# Patient Record
Sex: Male | Born: 1965 | Race: White | Hispanic: No | Marital: Married | Smoking: Never smoker
Health system: Southern US, Community
[De-identification: ages and names within clinical notes are randomized; demographics above are authoritative.]

## PROBLEM LIST (undated history)

## (undated) DIAGNOSIS — L03115 Cellulitis of right lower limb: Secondary | ICD-10-CM

---

## 1986-11-16 HISTORY — PX: MANDIBLE FRACTURE SURGERY: SHX706

## 2012-11-16 HISTORY — PX: SHOULDER ARTHROSCOPY W/ ROTATOR CUFF REPAIR: SHX2400

## 2013-05-21 ENCOUNTER — Encounter (HOSPITAL_COMMUNITY): Payer: Self-pay | Admitting: Emergency Medicine

## 2013-05-21 ENCOUNTER — Emergency Department (HOSPITAL_COMMUNITY)
Admission: EM | Admit: 2013-05-21 | Discharge: 2013-05-21 | Disposition: A | Discharge status: Home / Self Care | Payer: BC Managed Care – PPO | Attending: Emergency Medicine | Admitting: Emergency Medicine

## 2013-05-21 DIAGNOSIS — Y998 Other external cause status: Secondary | ICD-10-CM | POA: Insufficient documentation

## 2013-05-21 DIAGNOSIS — L0231 Cutaneous abscess of buttock: Secondary | ICD-10-CM | POA: Insufficient documentation

## 2013-05-21 DIAGNOSIS — Y929 Unspecified place or not applicable: Secondary | ICD-10-CM | POA: Insufficient documentation

## 2013-05-21 DIAGNOSIS — Z79899 Other long term (current) drug therapy: Secondary | ICD-10-CM | POA: Insufficient documentation

## 2013-05-21 DIAGNOSIS — L03317 Cellulitis of buttock: Secondary | ICD-10-CM

## 2013-05-21 MED ORDER — OXYCODONE-ACETAMINOPHEN 5-325 MG PO TABS: 1.0000 | ORAL_TABLET | Freq: Once | ORAL | Status: DC

## 2013-05-21 MED ORDER — OXYCODONE-ACETAMINOPHEN 5-325 MG PO TABS: ORAL_TABLET | ORAL | Status: DC

## 2013-05-21 MED ORDER — SULFAMETHOXAZOLE-TRIMETHOPRIM 800-160 MG PO TABS: 1.0000 | ORAL_TABLET | Freq: Three times a day (TID) | ORAL | Status: DC

## 2013-05-21 NOTE — ED Provider Notes (Signed)
History  This chart was scribed for non-physician practitioner working with Juliet Rude. Rubin Payor, MD by Greggory Stallion, ED scribe. This patient was seen in room TR08C/TR08C and the patient's care was started at 3:22 PM.  CSN: [id number] Arrival date & time 05/21/13  1456   Chief Complaint  Patient presents with  . Insect Bite   The history is provided by the patient. No language interpreter was used.    HPI Comments: Edwin Bishop is a 47 y.o. male who presents to the Emergency Department complaining of an insect bite to his right upper thigh that happened three days ago. He states he thinks it is a spider bite. Pt rates his pain 5/10. He states it jumps to an 8/10 if he bumps the injured area. Pt states he was seen at a different facility and given Keflex with little relief. Denies fever, chills, nausea, vomiting.   History reviewed. No pertinent past medical history. History reviewed. No pertinent past surgical history. History reviewed. No pertinent family history. History  Substance Use Topics  . Smoking status: Never Smoker   . Smokeless tobacco: Not on file  . Alcohol Use: Yes     Comment: occ    Review of Systems  Constitutional: Negative for fever and diaphoresis.  HENT: Negative for neck pain and neck stiffness.   Eyes: Negative for visual disturbance.  Respiratory: Negative for apnea, chest tightness and shortness of breath.   Cardiovascular: Negative for chest pain and palpitations.  Gastrointestinal: Negative for nausea, vomiting, diarrhea and constipation.  Genitourinary: Negative for dysuria.  Musculoskeletal: Negative for gait problem.  Skin: Positive for wound. Negative for rash.  Neurological: Negative for dizziness, weakness, light-headedness, numbness and headaches.    Allergies  Review of patient's allergies indicates no known allergies.  Home Medications   Current Outpatient Rx  Name  Route  Sig  Dispense  Refill  . cephALEXin (KEFLEX) 500 MG  capsule   Oral   Take 500 mg by mouth 4 (four) times daily.         . fexofenadine (ALLEGRA) 180 MG tablet   Oral   Take 180 mg by mouth daily as needed (allergies).          BP 133/87  Pulse 103  Temp(Src) 98.3 F (36.8 C) (Oral)  Resp 18  SpO2 98%  Physical Exam  Nursing note and vitals reviewed. Constitutional: He is oriented to person, place, and time. He appears well-developed and well-nourished. No distress.  HENT:  Head: Normocephalic and atraumatic.  Eyes: Conjunctivae and EOM are normal.  Neck: Normal range of motion. Neck supple.  No meningeal signs  Cardiovascular: Normal rate, regular rhythm and normal heart sounds.  Exam reveals no gallop and no friction rub.   No murmur heard. Pulmonary/Chest: Effort normal and breath sounds normal. No respiratory distress. He has no wheezes. He has no rales. He exhibits no tenderness.  Abdominal: Soft. Bowel sounds are normal. He exhibits no distension. There is no tenderness. There is no rebound and no guarding.  Musculoskeletal: Normal range of motion. He exhibits no edema and no tenderness.  Neurological: He is alert and oriented to person, place, and time. No cranial nerve deficit.  Skin: Skin is warm and dry. He is not diaphoretic.  9 cm erythematous area over right buttocks. Warm and painful to touch.     ED Course  Procedures (including critical care time)  DIAGNOSTIC STUDIES: Oxygen Saturation is 98% on RA, normal by my interpretation.    COORDINATION  OF CARE: 3:52 PM-Discussed treatment plan which includes [place] to see if it needs to be drained, pain medication, and antibiotic with pt at bedside and pt agreed to plan.   Labs Reviewed - No data to display No results found. 1. Cellulitis of right buttock     MDM  Discussed pt case with Dr. Rubin Payor considering course of oral abx vs IV. Given that pt was started only on Keflex with no MRSA coverage, was afebrile at this point, no red streaking, and ultra sound  did not show any amenable drainage, agreed to add Bactrim to abx course, continue warm compresses, and directed return precautions for possible IV abx treatment. Discussed reasons to seek immediate care. Patient expresses understanding and agrees with plan. I personally performed the services described in this documentation, which was scribed in my presence. The recorded information has been reviewed and is accurate.    Glade Nurse, PA-C 05/22/13 2336  Glade Nurse, PA-C 05/22/13 2337

## 2013-05-21 NOTE — ED Notes (Signed)
Pt c/o possible spider bite to right upper thigh; redness and swelling noted with pain; pt sts started on cephalexin PO but not helping

## 2013-05-22 ENCOUNTER — Inpatient Hospital Stay (HOSPITAL_COMMUNITY): Payer: BC Managed Care – PPO

## 2013-05-22 ENCOUNTER — Encounter (HOSPITAL_COMMUNITY): Payer: Self-pay | Admitting: Emergency Medicine

## 2013-05-22 ENCOUNTER — Inpatient Hospital Stay (HOSPITAL_COMMUNITY)
Admission: EM | Admit: 2013-05-22 | Discharge: 2013-05-24 | DRG: 278 | Disposition: A | Discharge status: Home / Self Care | Payer: BC Managed Care – PPO | Attending: Internal Medicine | Admitting: Internal Medicine

## 2013-05-22 DIAGNOSIS — L0390 Cellulitis, unspecified: Secondary | ICD-10-CM | POA: Diagnosis present

## 2013-05-22 DIAGNOSIS — L03119 Cellulitis of unspecified part of limb: Principal | ICD-10-CM | POA: Diagnosis present

## 2013-05-22 DIAGNOSIS — W57XXXA Bitten or stung by nonvenomous insect and other nonvenomous arthropods, initial encounter: Secondary | ICD-10-CM | POA: Diagnosis present

## 2013-05-22 DIAGNOSIS — L02419 Cutaneous abscess of limb, unspecified: Principal | ICD-10-CM | POA: Diagnosis present

## 2013-05-22 DIAGNOSIS — T148 Other injury of unspecified body region: Secondary | ICD-10-CM | POA: Diagnosis present

## 2013-05-22 DIAGNOSIS — F17200 Nicotine dependence, unspecified, uncomplicated: Secondary | ICD-10-CM | POA: Diagnosis present

## 2013-05-22 DIAGNOSIS — M729 Fibroblastic disorder, unspecified: Secondary | ICD-10-CM | POA: Diagnosis present

## 2013-05-22 HISTORY — DX: Cellulitis of right lower limb: L03.115

## 2013-05-22 LAB — CBC WITH DIFFERENTIAL/PLATELET
Hemoglobin: 15.1 g/dL (ref 13.0–17.0)
Lymphocytes Relative: 18 % (ref 12–46)
Lymphs Abs: 1.7 10*3/uL (ref 0.7–4.0)
Monocytes Relative: 10 % (ref 3–12)
Neutro Abs: 6.8 10*3/uL (ref 1.7–7.7)
Neutrophils Relative %: 70 % (ref 43–77)
Platelets: 203 10*3/uL (ref 150–400)
RBC: 4.78 MIL/uL (ref 4.22–5.81)
WBC: 9.6 10*3/uL (ref 4.0–10.5)

## 2013-05-22 LAB — CBC
MCH: 30.8 pg (ref 26.0–34.0)
MCV: 89.7 fL (ref 78.0–100.0)
Platelets: 197 10*3/uL (ref 150–400)
RDW: 12.2 % (ref 11.5–15.5)
WBC: 9.3 10*3/uL (ref 4.0–10.5)

## 2013-05-22 LAB — BASIC METABOLIC PANEL
CO2: 24 mEq/L (ref 19–32)
Chloride: 100 mEq/L (ref 96–112)
GFR calc non Af Amer: 68 mL/min — ABNORMAL LOW (ref >90)
Glucose, Bld: 102 mg/dL — ABNORMAL HIGH (ref 70–99)
Potassium: 3.8 mEq/L (ref 3.5–5.1)
Sodium: 135 mEq/L (ref 135–145)

## 2013-05-22 LAB — HEPATIC FUNCTION PANEL
ALT: 21 U/L (ref 0–53)
Alkaline Phosphatase: 82 U/L (ref 39–117)
Bilirubin, Direct: 0.1 mg/dL (ref 0.0–0.3)
Indirect Bilirubin: 0.4 mg/dL (ref 0.3–0.9)

## 2013-05-22 MED ORDER — ONDANSETRON HCL 4 MG PO TABS: 4.0000 mg | ORAL_TABLET | Freq: Four times a day (QID) | ORAL | Status: DC | PRN

## 2013-05-22 MED ORDER — PIPERACILLIN-TAZOBACTAM 3.375 G IVPB
3.3750 g | Freq: Three times a day (TID) | INTRAVENOUS | Status: DC
  Administered 2013-05-22 – 2013-05-24 (×6): 3.375 g via INTRAVENOUS
  Filled 2013-05-22 (×9): qty 50

## 2013-05-22 MED ORDER — SODIUM CHLORIDE 0.9 % IJ SOLN
3.0000 mL | Freq: Two times a day (BID) | INTRAMUSCULAR | Status: DC
  Administered 2013-05-22 – 2013-05-24 (×3): 3 mL via INTRAVENOUS

## 2013-05-22 MED ORDER — VANCOMYCIN HCL 10 G IV SOLR
1500.0000 mg | Freq: Two times a day (BID) | INTRAVENOUS | Status: DC
  Administered 2013-05-22 – 2013-05-24 (×4): 1500 mg via INTRAVENOUS
  Filled 2013-05-22 (×6): qty 1500

## 2013-05-22 MED ORDER — ENOXAPARIN SODIUM 40 MG/0.4ML ~~LOC~~ SOLN
40.0000 mg | SUBCUTANEOUS | Status: DC
  Administered 2013-05-22 – 2013-05-23 (×2): 40 mg via SUBCUTANEOUS
  Filled 2013-05-22 (×3): qty 0.4

## 2013-05-22 MED ORDER — VANCOMYCIN HCL IN DEXTROSE 1-5 GM/200ML-% IV SOLN
1000.0000 mg | Freq: Once | INTRAVENOUS | Status: AC
  Administered 2013-05-22: 1000 mg via INTRAVENOUS
  Filled 2013-05-22: qty 200

## 2013-05-22 MED ORDER — OXYCODONE HCL 5 MG PO TABS
5.0000 mg | ORAL_TABLET | ORAL | Status: DC | PRN
  Administered 2013-05-23 (×2): 5 mg via ORAL
  Filled 2013-05-22 (×3): qty 1

## 2013-05-22 MED ORDER — HYDROMORPHONE HCL PF 1 MG/ML IJ SOLN
0.5000 mg | INTRAMUSCULAR | Status: DC | PRN
  Administered 2013-05-22 – 2013-05-23 (×3): 0.5 mg via INTRAVENOUS
  Filled 2013-05-22 (×3): qty 1

## 2013-05-22 MED ORDER — IOHEXOL 300 MG/ML  SOLN
100.0000 mL | Freq: Once | INTRAMUSCULAR | Status: AC | PRN
  Administered 2013-05-22: 100 mL via INTRAVENOUS

## 2013-05-22 MED ORDER — SODIUM CHLORIDE 0.9 % IV SOLN
INTRAVENOUS | Status: DC
  Administered 2013-05-22: 16:00:00 via INTRAVENOUS
  Administered 2013-05-22: 75 mL/h via INTRAVENOUS

## 2013-05-22 MED ORDER — ONDANSETRON HCL 4 MG/2ML IJ SOLN
4.0000 mg | Freq: Four times a day (QID) | INTRAMUSCULAR | Status: DC | PRN
  Administered 2013-05-22: 4 mg via INTRAVENOUS
  Filled 2013-05-22: qty 2
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
## 2013-05-22 NOTE — Progress Notes (Signed)
ANTIBIOTIC CONSULT NOTE - INITIAL  Pharmacy Consult for Vancomycin/Zosyn Indication: right leg wound infection  No Known Allergies  Patient Measurements: Height: 6' (182.9 cm) Weight: 205 lb (92.987 kg) IBW/kg (Calculated) : 77.6  Vital Signs: Temp: 98.9 F (37.2 C) (07/07 1340) Temp src: Oral (07/07 1340) BP: 117/80 mmHg (07/07 1340) Pulse Rate: 83 (07/07 1340) Intake/Output from previous day:   Intake/Output from this shift:    Labs:  Recent Labs  05/22/13 1303  WBC 9.6  HGB 15.1  PLT 203  CREATININE 1.24   Estimated Creatinine Clearance: 80.8 ml/min (by C-G formula based on Cr of 1.24). No results found for this basename: VANCOTROUGH, VANCOPEAK, VANCORANDOM, GENTTROUGH, GENTPEAK, GENTRANDOM, TOBRATROUGH, TOBRAPEAK, TOBRARND, AMIKACINPEAK, AMIKACINTROU, AMIKACIN,  in the last 72 hours   Microbiology: No results found for this or any previous visit (from the past 720 hour(s)).  Medical History: History reviewed. No pertinent past medical history.  Medications:   (Not in a hospital admission) Assessment: 47 y/o male patient admitted with worsening and presumed insect bite to right thigh requiring broad spectrum antibiotics for r/o necrotizing fascitis. Previously receiving keflex and bactrim prior to admission. Will begin vancomycin and zosyn, received 1g vanc in ED at 1200.  Goal of Therapy:  Vancomycin trough level 10-15 mcg/ml  Plan:  Vancomycin 1500mg  IV q12h, zosyn 3.375g IV q8h and monitor renal function. Measure antibiotic drug levels at steady state Follow up culture results  Verlene Mayer, PharmD, BCPS Pager [phone] 05/22/2013,3:31 PM

## 2013-05-22 NOTE — Consult Note (Signed)
Reason for Consult:cellulitis right thigh Referring Physician: Dr. Creola Corn  Edwin Bishop is an 47 y.o. male.  HPI: asked to the patient at the request of Dr. Timothy Lasso for cellulitis right upper thigh. [place] and felt an insect bite involving his right upper lateral thigh. He did not see with insect look like. Over the last 4 days he has developed increasing redness, swelling and pain. He has been on Bactrim and Keflex and the redness is more severe now with blistering centrally. No fever or chills. No nausea or vomiting. Denies headache or recent tick exposure. CT of the thigh was done which showed inflammation without abscess.  History reviewed. No pertinent past medical history.  Past Surgical History  Procedure Laterality Date  . Shoulder arthroscopy w/ rotator cuff repair Left 2014  . Mandible fracture surgery  1988    History reviewed. No pertinent family history.  Social History:  reports that he has never smoked. He has never used smokeless tobacco. He reports that  drinks alcohol. He reports that he does not use illicit drugs.  Allergies: No Known Allergies  Medications: I have reviewed the patient's current medications.  Results for orders placed during the hospital encounter of 05/22/13 (from the past 48 hour(s))  CBC WITH DIFFERENTIAL     Status: None   Collection Time    05/22/13  1:03 PM      Result Value Range   WBC 9.6  4.0 - 10.5 K/uL   RBC 4.78  4.22 - 5.81 MIL/uL   Hemoglobin 15.1  13.0 - 17.0 g/dL   HCT 16.1  09.6 - 04.5 %   MCV 88.3  78.0 - 100.0 fL   MCH 31.6  26.0 - 34.0 pg   MCHC 35.8  30.0 - 36.0 g/dL   RDW 40.9  81.1 - 91.4 %   Platelets 203  150 - 400 K/uL   Neutrophils Relative % 70  43 - 77 %   Neutro Abs 6.8  1.7 - 7.7 K/uL   Lymphocytes Relative 18  12 - 46 %   Lymphs Abs 1.7  0.7 - 4.0 K/uL   Monocytes Relative 10  3 - 12 %   Monocytes Absolute 1.0  0.1 - 1.0 K/uL   Eosinophils Relative 2  0 - 5 %   Eosinophils  Absolute 0.2  0.0 - 0.7 K/uL   Basophils Relative 0  0 - 1 %   Basophils Absolute 0.0  0.0 - 0.1 K/uL  BASIC METABOLIC PANEL     Status: Abnormal   Collection Time    05/22/13  1:03 PM      Result Value Range   Sodium 135  135 - 145 mEq/L   Potassium 3.8  3.5 - 5.1 mEq/L   Chloride 100  96 - 112 mEq/L   CO2 24  19 - 32 mEq/L   Glucose, Bld 102 (*) 70 - 99 mg/dL   BUN 12  6 - 23 mg/dL   Creatinine, Ser 7.82  0.50 - 1.35 mg/dL   Calcium 8.9  8.4 - 95.6 mg/dL   GFR calc non Af Amer 68 (*) >90 mL/min   GFR calc Af Amer 79 (*) >90 mL/min   Comment:            The eGFR has been calculated     using the CKD EPI equation.     This calculation has not been     validated in all clinical  situations.     eGFR's persistently     <90 mL/min signify     possible Chronic Kidney Disease.  CBC     Status: None   Collection Time    05/22/13  4:43 PM      Result Value Range   WBC 9.3  4.0 - 10.5 K/uL   RBC 4.58  4.22 - 5.81 MIL/uL   Hemoglobin 14.1  13.0 - 17.0 g/dL   HCT 09.8  11.9 - 14.7 %   MCV 89.7  78.0 - 100.0 fL   MCH 30.8  26.0 - 34.0 pg   MCHC 34.3  30.0 - 36.0 g/dL   RDW 82.9  56.2 - 13.0 %   Platelets 197  150 - 400 K/uL  CREATININE, SERUM     Status: Abnormal   Collection Time    05/22/13  4:43 PM      Result Value Range   Creatinine, Ser 1.09  0.50 - 1.35 mg/dL   GFR calc non Af Amer 79 (*) >90 mL/min   GFR calc Af Amer >90  >90 mL/min   Comment:            The eGFR has been calculated     using the CKD EPI equation.     This calculation has not been     validated in all clinical     situations.     eGFR's persistently     <90 mL/min signify     possible Chronic Kidney Disease.  HEPATIC FUNCTION PANEL     Status: None   Collection Time    05/22/13  4:43 PM      Result Value Range   Total Protein 6.9  6.0 - 8.3 g/dL   Albumin 3.6  3.5 - 5.2 g/dL   AST 18  0 - 37 U/L   ALT 21  0 - 53 U/L   Alkaline Phosphatase 82  39 - 117 U/L   Total Bilirubin 0.5  0.3 -  1.2 mg/dL   Bilirubin, Direct 0.1  0.0 - 0.3 mg/dL   Indirect Bilirubin 0.4  0.3 - 0.9 mg/dL    Ct Femur Right W Contrast  05/22/2013   *RADIOLOGY REPORT*  Clinical Data: Inflamed area of right hip/femur.  Fever.  Symptoms for 4 days.  Rule out necrotizing fasciitis.  CT OF THE RIGHT LOWER EXTREMITY WITH CONTRAST  Technique:  Continous axial images were obtained of the right femur after contrast administration.  Contrast: OMNIPAQUE IOHEXOL 300 MG/ML  SOLN  Comparison: None.  Findings: Imaging from the level of the acetabulum through the knee joint.  Soft tissue windows demonstrate  skin thickening and mild subcutaneous edema centered about the lateral aspect of the proximal right thigh musculature.  Example images 28 - 51/series 4. There is a suggestion of mild edema within the intramuscular planes of the lateral extensor musculature.  Example image 55/series 4. No significant enhancement in this area.  No fluid collections within the subcutaneous tissues or intramuscular regions to suggest abscess.  Subtle intramuscular edema terminates well above the patella.  Prominent right inguinal nodes at up to 1.1 cm, likely reactive.  No evidence of soft tissue gas.  Arterial structures are all patent.  No osseous abnormality to suggest osteomyelitis.  IMPRESSION:  1.  Skin thickening and subcutaneous edema lateral to the right thigh musculature, most consistent with cellulitis. 2.  Subtle edema suspected within the intramuscular planes of the lateral extensor musculature.  Mild  fasciitis  cannot be excluded. No specific findings of necrotizing fasciitis such as abscess or soft tissue/intramuscular gas. 3.  MRI with contrast may be of increased sensitivity to evaluate the extent of inflammation.   Original Report Authenticated By: Jeronimo Greaves, M.D.    Review of Systems  Constitutional: Negative for fever and chills.  Eyes: Negative.   Respiratory: Negative.   Cardiovascular: Negative.   Gastrointestinal:  Negative.   Genitourinary: Negative.   Musculoskeletal: Negative.   Skin: Positive for itching.  Neurological: Negative.  Negative for headaches.  Endo/Heme/Allergies: Negative.    Blood pressure 129/80, pulse 76, temperature 98.4 F (36.9 C), temperature source Oral, resp. rate 20, height 6' (1.829 m), weight 186 lb 9.6 oz (84.641 kg), SpO2 98.00%. Physical Exam  Constitutional: He appears well-developed and well-nourished.  HENT:  Head: Normocephalic and atraumatic.  Eyes: Pupils are equal, round, and reactive to light. No scleral icterus.  Neck: Neck supple.  Musculoskeletal: Normal range of motion.  Skin:     Psychiatric: He has a normal mood and affect. His behavior is normal. Judgment and thought content normal.    Assessment/Plan: Right thigh cellulitis without abscess secondary to insect bite  Agree with IV vancomycin and Zosyn.  We'll reexamine in a.m. If worse, may require surgical debridement. Not systemically ill at this point in time.  Secilia Apps A. 05/22/2013, 8:00 PM

## 2013-05-22 NOTE — ED Notes (Signed)
Admit Doctor at bedside.  

## 2013-05-22 NOTE — ED Provider Notes (Signed)
History    CSN: [id number] Arrival date & time 05/22/13  1111  First MD Initiated Contact with Patient 05/22/13 1112     Chief Complaint  Patient presents with  . Insect Bite   (Consider location/radiation/quality/duration/timing/severity/associated sxs/prior Treatment) HPI Comments: Patient is a 47 year old male who presents today with worsening wound infection. [place] he felt something crawl up his leg and bite him. He did not see the insect that bit him. He was seen at urgent care and was given Bactrim. He was seen at the emergency room yesterday and was given Keflex. He can take most antibiotics. The pain has worsened since yesterday. He describes the pain as throbbing. The area of erythema surrounding the wound has increased past the skin markings made yesterday. There is no streaking. He has had fevers which he states the highest he has never measured was 100.4. There were blisters yesterday, but the blisters have also increased in size.  The history is provided by the patient. No language interpreter was used.   No past medical history on file. No past surgical history on file. No family history on file. History  Substance Use Topics  . Smoking status: Never Smoker   . Smokeless tobacco: Not on file  . Alcohol Use: Yes     Comment: occ    Review of Systems  Constitutional: Positive for fever and chills.  Respiratory: Negative for shortness of breath.   Cardiovascular: Negative for chest pain.  Gastrointestinal: Positive for nausea. Negative for vomiting.  Skin: Positive for rash.  All other systems reviewed and are negative.    Allergies  Review of patient's allergies indicates no known allergies.  Home Medications   Current Outpatient Rx  Name  Route  Sig  Dispense  Refill  . cephALEXin (KEFLEX) 500 MG capsule   Oral   Take 500 mg by mouth 4 (four) times daily.         . fexofenadine (ALLEGRA) 180 MG tablet   Oral    Take 180 mg by mouth daily as needed (allergies).         Marland Kitchen oxyCODONE-acetaminophen (PERCOCET/ROXICET) 5-325 MG per tablet      Take one or two tablets every 4 to 6 hours as needed for severe pain - Do not take with Tylenol as this tablet already contains tylenol   10 tablet   0   . sulfamethoxazole-trimethoprim (SEPTRA DS) 800-160 MG per tablet   Oral   Take 1 tablet by mouth 3 (three) times daily.   30 tablet   0    BP 141/93  Pulse 67  Temp(Src) 99.1 F (37.3 C)  Resp 16  Ht 6' (1.829 m)  Wt 205 lb (92.987 kg)  BMI 27.8 kg/m2  SpO2 98% Physical Exam  Nursing note and vitals reviewed. Constitutional: He is oriented to person, place, and time. He appears well-developed and well-nourished. No distress.  HENT:  Head: Normocephalic and atraumatic.  Right Ear: External ear normal.  Left Ear: External ear normal.  Nose: Nose normal.  Eyes: Conjunctivae are normal.  Neck: Normal range of motion. No tracheal deviation present.  Cardiovascular: Normal rate, regular rhythm and normal heart sounds.   Pulmonary/Chest: Effort normal and breath sounds normal. No stridor.  Abdominal: Soft. He exhibits no distension. There is no tenderness.  Musculoskeletal: Normal range of motion.  Neurological: He is alert and oriented to person, place, and time.  Skin: Skin is warm and  dry. He is not diaphoretic. There is erythema.  10 cm area of erythema with circular area of ecchymosis; some blistering centrally; streaking of erythema  US performed - no fluid collection   Psychiatric: He has a normal mood and affect. His behavior is normal.    ED Course  Procedures (including critical care time) Labs Reviewed  CBC WITH DIFFERENTIAL  BASIC METABOLIC PANEL  B. BURGDORFI ANTIBODIES  ROCKY MTN SPOTTED FVR AB, IGG-BLOOD   Ct Femur Right W Contrast  05/22/2013   *RADIOLOGY REPORT*  Clinical Data: Inflamed area of right hip/femur.  Fever.  Symptoms for 4 days.  Rule out necrotizing fasciitis.   CT OF THE RIGHT LOWER EXTREMITY WITH CONTRAST  Technique:  Continous axial images were obtained of the right femur after contrast administration.  Contrast: OMNIPAQUE IOHEXOL 300 MG/ML  SOLN  Comparison: None.  Findings: Imaging from the level of the acetabulum through the knee joint.  Soft tissue windows demonstrate  skin thickening and mild subcutaneous edema centered about the lateral aspect of the proximal right thigh musculature.  Example images 28 - 51/series 4. There is a suggestion of mild edema within the intramuscular planes of the lateral extensor musculature.  Example image 55/series 4. No significant enhancement in this area.  No fluid collections within the subcutaneous tissues or intramuscular regions to suggest abscess.  Subtle intramuscular edema terminates well above the patella.  Prominent right inguinal nodes at up to 1.1 cm, likely reactive.  No evidence of soft tissue gas.  Arterial structures are all patent.  No osseous abnormality to suggest osteomyelitis.  IMPRESSION:  1.  Skin thickening and subcutaneous edema lateral to the right thigh musculature, most consistent with cellulitis. 2.  Subtle edema suspected within the intramuscular planes of the lateral extensor musculature.  Mild  fasciitis cannot be excluded. No specific findings of necrotizing fasciitis such as abscess or soft tissue/intramuscular gas. 3.  MRI with contrast may be of increased sensitivity to evaluate the extent of inflammation.   Original Report Authenticated By: Jeronimo Greaves, M.D.   1. Cellulitis     MDM  Patient presents with worsening cellulitis around possible insect bite. IV Vancomycin given in ED. Titers for lyme disease and RMSF were drawn. Results pending. Streaking worsened in ED stay. Call placed to [facility] who agree to admit. Vital signs stable for transfer to floor.   Mora Bellman, PA-C 05/23/13 1127

## 2013-05-22 NOTE — ED Provider Notes (Signed)
Medical screening examination/treatment/procedure(s) were conducted as a shared visit with non-physician practitioner(s) and myself.  I personally evaluated the patient during the encounter  Pt with onset of pain, redness to lateral right thigh, seen in the ED, on keflex and bactrim, now redness is worsening, streaking developing to proximal thigh.  Unsure of what may have bitten him.  On exam, tender, not fluctuant, central blistering and a ring of ecchymosis as though the source had some local toxicity to it causing a local necrosis or coagulopathy.  No bruising or bleeding elsewhere. Due to treatment failure with oral, given IV abx here, but it seems redness is continuing to worsen, will admit for observation and continued IV abx.    Impression: Cellulitis of right thigh, subsequent encounter   [name], MD 05/22/13 1443

## 2013-05-22 NOTE — ED Notes (Signed)
Onset 3 days ago right thigh lateral aspect insect bite with area growing in size light pink redness blue purple. Skin marked one day ago and redness past boarder. Pain 8/10 achy dull sharp.

## 2013-05-22 NOTE — H&P (Addendum)
[facility] History and Physical  Edwin Bishop BMW:[id number] DOB: 1966-07-21 DOA: 05/22/2013  Referring physician:  PCP: No primary provider on file.   Chief Complaint:     HPI:  47 year old male who presents today with worsening wound infection. 4 [place] and states he felt something crawl up his leg and bite him. He did not see the insect that bit him. He was seen at urgent care and was given Bactrim. He was seen at the emergency room yesterday and was given Keflex. He can take most antibiotics. The pain has worsened since yesterday. He describes the pain as throbbing. The area of erythema surrounding the wound has increased past the skin markings made yesterday. There is no streaking. He has had fevers which he states the highest he has never measured was 100.4. There were blisters yesterday, but the blisters have also increased in size.       Review of Systems: negative for the following  Constitutional: Denies fever, chills, diaphoresis, appetite change and fatigue.  HEENT: Denies photophobia, eye pain, redness, hearing loss, ear pain, congestion, sore throat, rhinorrhea, sneezing, mouth sores, trouble swallowing, neck pain, neck stiffness and tinnitus.  Respiratory: Denies SOB, DOE, cough, chest tightness, and wheezing.  Cardiovascular: Denies chest pain, palpitations and leg swelling.  Gastrointestinal: Denies nausea, vomiting, abdominal pain, diarrhea, constipation, blood in stool and abdominal distention.  Genitourinary: Denies dysuria, urgency, frequency, hematuria, flank pain and difficulty urinating.  Musculoskeletal: Denies myalgias, back pain, joint swelling, arthralgias and gait problem.  Skin: Denies pallor, rash and wound.  Neurological: Denies dizziness, seizures, syncope, weakness, light-headedness, numbness and headaches.  Hematological: Denies adenopathy. Easy bruising, personal or family bleeding history  Psychiatric/Behavioral:  Denies suicidal ideation, mood changes, confusion, nervousness, sleep disturbance and agitation       History reviewed. No pertinent past medical history.   Past Surgical History  Procedure Laterality Date  . Rotator cuff repair        Social History:  reports that he has never smoked. He does not have any smokeless tobacco history on file. He reports that  drinks alcohol. He reports that he does not use illicit drugs.    No Known Allergies  No family history on file.   Prior to Admission medications   Medication Sig Start Date End Date Taking? Authorizing Provider  cephALEXin (KEFLEX) 500 MG capsule Take 500 mg by mouth 4 (four) times daily. 05/20/13  Yes Historical Provider, MD  fexofenadine (ALLEGRA) 180 MG tablet Take 180 mg by mouth daily as needed (allergies).   Yes Historical Provider, MD  oxyCODONE-acetaminophen (PERCOCET/ROXICET) 5-325 MG per tablet Take one or two tablets every 4 to 6 hours as needed for severe pain - Do not take with Tylenol as this tablet already contains tylenol 05/21/13  Yes Glade Nurse, PA-C  sulfamethoxazole-trimethoprim (SEPTRA DS) 800-160 MG per tablet Take 1 tablet by mouth 3 (three) times daily. 05/21/13  Yes Glade Nurse, PA-C     Physical Exam: Filed Vitals:   05/22/13 1125 05/22/13 1340  BP: 141/93 117/80  Pulse: 67 83  Temp: 99.1 F (37.3 C) 98.9 F (37.2 C)  TempSrc:  Oral  Resp: 16 18  Height: 6' (1.829 m)   Weight: 92.987 kg (205 lb)   SpO2: 98% 98%     Constitutional: Vital signs reviewed. Patient is a well-developed and well-nourished in no acute distress and cooperative with exam. Alert and oriented x3.  Head: Normocephalic and atraumatic  Ear: TM normal  bilaterally  Mouth: no erythema or exudates, MMM  Eyes: PERRL, EOMI, conjunctivae normal, No scleral icterus.  Neck: Supple, Trachea midline normal ROM, No JVD, mass, thyromegaly, or carotid bruit present.  Cardiovascular: RRR, S1 normal, S2 normal, no MRG, pulses  symmetric and intact bilaterally  Pulmonary/Chest: CTAB, no wheezes, rales, or rhonchi  Abdominal: Soft. Non-tender, non-distended, bowel sounds are normal, no masses, organomegaly, or guarding present.  GU: no CVA tenderness Musculoskeletal: No joint deformities, erythema, or stiffness, ROM full and no nontender Ext: no edema and no cyanosis, pulses palpable bilaterally (DP and PT)  Hematology: no cervical, inginal, or axillary adenopathy.  Neurological: A&O x3, Strenght is normal and symmetric bilaterally, cranial nerve II-XII are grossly intact, no focal motor deficit, sensory intact to light touch bilaterally.  Skin: Skin is warm and dry. He is not diaphoretic. There is erythema.  10 cm area of erythema with circular area of ecchymosis; some blistering centrally; streaking of erythema  US performed - no fluid collection   Psychiatric: Normal mood and affect. speech and behavior is normal. Judgment and thought content normal. Cognition and memory are normal.       Labs on Admission:    Basic Metabolic Panel:  Recent Labs Lab 05/22/13 1303  NA 135  K 3.8  CL 100  CO2 24  GLUCOSE 102*  BUN 12  CREATININE 1.24  CALCIUM 8.9   Liver Function Tests: No results found for this basename: AST, ALT, ALKPHOS, BILITOT, PROT, ALBUMIN,  in the last 168 hours No results found for this basename: LIPASE, AMYLASE,  in the last 168 hours No results found for this basename: AMMONIA,  in the last 168 hours CBC:  Recent Labs Lab 05/22/13 1303  WBC 9.6  NEUTROABS 6.8  HGB 15.1  HCT 42.2  MCV 88.3  PLT 203   Cardiac Enzymes: No results found for this basename: CKTOTAL, CKMB, CKMBINDEX, TROPONINI,  in the last 168 hours  BNP (last 3 results) No results found for this basename: PROBNP,  in the last 8760 hours    CBG: No results found for this basename: GLUCAP,  in the last 168 hours  Radiological Exams on Admission: No results found.  EKG: Independently reviewed.     Assessment/Plan Active Problems:   * No active hospital problems. *   Cellulitis The patient on vancomycin and Zosyn He may need a CT scan to rule out necrotizing fascitis Blood cultures have been drawn May need a surgical consultation as well           full Family Communication: bedside Disposition Plan: admit   Time spent: 70 mins   [facility] Pager [phone]  If 7PM-7AM, please contact night-coverage [email or web address] Password [email or web address] 05/22/2013, 3:10 PM

## 2013-05-23 LAB — ROCKY MTN SPOTTED FVR AB, IGM-BLOOD: RMSF IgM: 0.26 IV (ref 0.00–0.89)

## 2013-05-23 LAB — COMPREHENSIVE METABOLIC PANEL
AST: 17 U/L (ref 0–37)
Albumin: 3.4 g/dL — ABNORMAL LOW (ref 3.5–5.2)
Calcium: 8.8 mg/dL (ref 8.4–10.5)
Creatinine, Ser: 1.09 mg/dL (ref 0.50–1.35)

## 2013-05-23 LAB — CBC
Hemoglobin: 14 g/dL (ref 13.0–17.0)
MCH: 30.6 pg (ref 26.0–34.0)
MCV: 88.6 fL (ref 78.0–100.0)
RBC: 4.58 MIL/uL (ref 4.22–5.81)

## 2013-05-23 LAB — B. BURGDORFI ANTIBODIES: B burgdorferi Ab IgG+IgM: 0.21 {ISR}

## 2013-05-23 LAB — ROCKY MTN SPOTTED FVR AB, IGG-BLOOD: RMSF IgG: 0.23 IV

## 2013-05-23 NOTE — Progress Notes (Addendum)
Subjective: F/Up R Thigh Cellulitis. Less pain. Mild less Streaky Min improvement overnight. Appreciate Dr Luisa Hart eval   Objective: Vital signs in last 24 hours: Temp:  [98.2 F (36.8 C)-99.1 F (37.3 C)] 98.2 F (36.8 C) (07/08 0445) Pulse Rate:  [67-83] 80 (07/08 0445) Resp:  [16-20] 18 (07/08 0445) BP: (117-141)/(80-93) 127/80 mmHg (07/08 0445) SpO2:  [96 %-98 %] 96 % (07/08 0445) Weight:  [84.641 kg (186 lb 9.6 oz)-92.987 kg (205 lb)] 86.9 kg (191 lb 9.3 oz) (07/08 0445) Weight change:  Last BM Date: 05/22/13  CBG (last 3)  No results found for this basename: GLUCAP,  in the last 72 hours  Intake/Output from previous day:  Intake/Output Summary (Last 24 hours) at 05/23/13 0756 Last data filed at 05/22/13 2040  Gross per 24 hour  Intake    240 ml  Output      0 ml  Net    240 ml   07/07 0701 - 07/08 0700 In: 240 [P.O.:240] Out: -    Physical Exam  General appearance: A and O Resp: CTA Cardio: Reg Extremities: R Thigh Hip - necrotic circular Cellulitis with central Blister and mild surrounding redness. R Groin LN noted.   Lab Results:  Recent Labs  05/22/13 1303 05/22/13 1643 05/23/13 0450  NA 135  --  136  K 3.8  --  4.1  CL 100  --  102  CO2 24  --  26  GLUCOSE 102*  --  82  BUN 12  --  10  CREATININE 1.24 1.09 1.09  CALCIUM 8.9  --  8.8     Recent Labs  05/22/13 1643 05/23/13 0450  AST 18 17  ALT 21 21  ALKPHOS 82 80  BILITOT 0.5 0.7  PROT 6.9 6.4  ALBUMIN 3.6 3.4*     Recent Labs  05/22/13 1303 05/22/13 1643 05/23/13 0450  WBC 9.6 9.3 8.8  NEUTROABS 6.8  --   --   HGB 15.1 14.1 14.0  HCT 42.2 41.1 40.6  MCV 88.3 89.7 88.6  PLT 203 197 183    No results found for this basename: INR, PROTIME    No results found for this basename: CKTOTAL, CKMB, CKMBINDEX, TROPONINI,  in the last 72 hours  No results found for this basename: TSH, T4TOTAL, FREET3, T3FREE, THYROIDAB,  in the last 72 hours  No results found for this  basename: VITAMINB12, FOLATE, FERRITIN, TIBC, IRON, RETICCTPCT,  in the last 72 hours  Micro Results: No results found for this or any previous visit (from the past 240 hour(s)).   Studies/Results: Ct Femur Right W Contrast  05/22/2013   *RADIOLOGY REPORT*  Clinical Data: Inflamed area of right hip/femur.  Fever.  Symptoms for 4 days.  Rule out necrotizing fasciitis.  CT OF THE RIGHT LOWER EXTREMITY WITH CONTRAST  Technique:  Continous axial images were obtained of the right femur after contrast administration.  Contrast: OMNIPAQUE IOHEXOL 300 MG/ML  SOLN  Comparison: None.  Findings: Imaging from the level of the acetabulum through the knee joint.  Soft tissue windows demonstrate  skin thickening and mild subcutaneous edema centered about the lateral aspect of the proximal right thigh musculature.  Example images 28 - 51/series 4. There is a suggestion of mild edema within the intramuscular planes of the lateral extensor musculature.  Example image 55/series 4. No significant enhancement in this area.  No fluid collections within the subcutaneous tissues or intramuscular regions to suggest abscess.  Subtle intramuscular edema  terminates well above the patella.  Prominent right inguinal nodes at up to 1.1 cm, likely reactive.  No evidence of soft tissue gas.  Arterial structures are all patent.  No osseous abnormality to suggest osteomyelitis.  IMPRESSION:  1.  Skin thickening and subcutaneous edema lateral to the right thigh musculature, most consistent with cellulitis. 2.  Subtle edema suspected within the intramuscular planes of the lateral extensor musculature.  Mild  fasciitis cannot be excluded. No specific findings of necrotizing fasciitis such as abscess or soft tissue/intramuscular gas. 3.  MRI with contrast may be of increased sensitivity to evaluate the extent of inflammation.   Original Report Authenticated By: Jeronimo Greaves, M.D.     Medications: Scheduled: . enoxaparin (LOVENOX)  injection  40 mg Subcutaneous Q24H  . piperacillin-tazobactam (ZOSYN)  IV  3.375 g Intravenous Q8H  . sodium chloride  3 mL Intravenous Q12H  . vancomycin  1,500 mg Intravenous Q12H   Continuous: . sodium chloride 75 mL/hr at 05/22/13 1616     Assessment/Plan: Principal Problem:   Cellulitis  Right thigh cellulitis without abscess secondary to insect bite   IV Vncomycin and Zosyn. Pain control. CT showed:  1.  Skin thickening and subcutaneous edema lateral to the right thigh musculature, most consistent with cellulitis. 2.  Subtle edema suspected within the intramuscular planes of the lateral extensor musculature.  Mild  fasciitis cannot be excluded. No specific findings of necrotizing fasciitis such as abscess or soft tissue/intramuscular gas. 3.  MRI with contrast may be of increased sensitivity to evaluate the extent of inflammation.   Wound Care. Dress. He is currently NPO. Await Surgery re-eval this am. All labs are fine.  ID -  Anti-infectives   Start     Dose/Rate Route Frequency Ordered Stop   05/22/13 1800  vancomycin (VANCOCIN) 1,500 mg in sodium chloride 0.9 % 500 mL IVPB     1,500 mg 250 mL/hr over 120 Minutes Intravenous Every 12 hours 05/22/13 1537     05/22/13 1545  piperacillin-tazobactam (ZOSYN) IVPB 3.375 g     3.375 g 12.5 mL/hr over 240 Minutes Intravenous 3 times per day 05/22/13 1537     05/22/13 1200  vancomycin (VANCOCIN) IVPB 1000 mg/200 mL premix     1,000 mg 200 mL/hr over 60 Minutes Intravenous  Once 05/22/13 1153 05/22/13 1330     DVT Prophylaxis    LOS: 1 day   Heinrich Fertig M 05/23/2013, 7:56 AM

## 2013-05-23 NOTE — Progress Notes (Signed)
Pt gave permission for aromatherapy used on cellulitis on right thigh. Applied 6 drops of lavender in 16 oz. Of water and soaked 4 x 4 compresses in mixture. Will leave compresses on for one hour and reapply one hour later. Will monitor pt for any adverse reaction.  Peter Congo RN

## 2013-05-23 NOTE — ED Provider Notes (Signed)
Medical screening examination/treatment/procedure(s) were performed by non-physician practitioner and as supervising physician I was immediately available for consultation/collaboration.  Quanna Wittke R. Teale Goodgame, MD 05/23/13 1520 

## 2013-05-23 NOTE — Consult Note (Signed)
WOC consult Note Reason for Consult: Consult requested for right hip with insect bite.  Pt did not see what bit him; appearance is consistent with possible [employer]. CCS also following for assessment and need for possible debridement as wound evolves. Wound type: cellulitis; expect to evolve into full thickness tissue loss Measurement: 9X9cm area of erythemia which becomes more pronounced towards center.  Wound bed: 5X5cm area of blistering in middle of wound bed Drainage (amount, consistency, odor) small yellow drainage, no odor Periwound: Generalized edema; pt states he previously had streaking which has improved with IV Vancomycin Dressing procedure/placement/frequency: Warm soaks to decrease discomfort.  Foam dressing to protect from further injury and absorb drainage. Cammie Mcgee MSN, RN, CWOCN, [place], CNS [phone]

## 2013-05-23 NOTE — Progress Notes (Signed)
Patient ID: Edwin Bishop, male   DOB: 22-Apr-1966, 47 y.o.   MRN: [id number]    Subjective: Pt feels better this am. Less "hardness" and redness per patient.  Objective: Vital signs in last 24 hours: Temp:  [98.2 F (36.8 C)-99.1 F (37.3 C)] 98.2 F (36.8 C) (07/08 0445) Pulse Rate:  [67-83] 80 (07/08 0445) Resp:  [16-20] 18 (07/08 0445) BP: (117-141)/(80-93) 127/80 mmHg (07/08 0445) SpO2:  [96 %-98 %] 96 % (07/08 0445) Weight:  [186 lb 9.6 oz (84.641 kg)-205 lb (92.987 kg)] 191 lb 9.3 oz (86.9 kg) (07/08 0445) Last BM Date: 05/22/13  Intake/Output from previous day: 07/07 0701 - 07/08 0700 In: 240 [P.O.:240] Out: -  Intake/Output this shift:    PE: Ext: right upper lateral thigh with large circular area of erythema and induration, however, this has lessened based on some of the lines.  Vesicle in the central most portion of the affected area with clear serous fluid.  No fluctuance or evidence of abscess  Lab Results:   Recent Labs  05/22/13 1643 05/23/13 0450  WBC 9.3 8.8  HGB 14.1 14.0  HCT 41.1 40.6  PLT 197 183   BMET  Recent Labs  05/22/13 1303 05/22/13 1643 05/23/13 0450  NA 135  --  136  K 3.8  --  4.1  CL 100  --  102  CO2 24  --  26  GLUCOSE 102*  --  82  BUN 12  --  10  CREATININE 1.24 1.09 1.09  CALCIUM 8.9  --  8.8   PT/INR No results found for this basename: LABPROT, INR,  in the last 72 hours CMP     Component Value Date/Time   NA 136 05/23/2013 0450   K 4.1 05/23/2013 0450   CL 102 05/23/2013 0450   CO2 26 05/23/2013 0450   GLUCOSE 82 05/23/2013 0450   BUN 10 05/23/2013 0450   CREATININE 1.09 05/23/2013 0450   CALCIUM 8.8 05/23/2013 0450   PROT 6.4 05/23/2013 0450   ALBUMIN 3.4* 05/23/2013 0450   AST 17 05/23/2013 0450   ALT 21 05/23/2013 0450   ALKPHOS 80 05/23/2013 0450   BILITOT 0.7 05/23/2013 0450   GFRNONAA 79* 05/23/2013 0450   GFRAA >90 05/23/2013 0450   Lipase  No results found for this basename: lipase       Studies/Results: Ct Femur Right  W Contrast  05/22/2013   *RADIOLOGY REPORT*  Clinical Data: Inflamed area of right hip/femur.  Fever.  Symptoms for 4 days.  Rule out necrotizing fasciitis.  CT OF THE RIGHT LOWER EXTREMITY WITH CONTRAST  Technique:  Continous axial images were obtained of the right femur after contrast administration.  Contrast: OMNIPAQUE IOHEXOL 300 MG/ML  SOLN  Comparison: None.  Findings: Imaging from the level of the acetabulum through the knee joint.  Soft tissue windows demonstrate  skin thickening and mild subcutaneous edema centered about the lateral aspect of the proximal right thigh musculature.  Example images 28 - 51/series 4. There is a suggestion of mild edema within the intramuscular planes of the lateral extensor musculature.  Example image 55/series 4. No significant enhancement in this area.  No fluid collections within the subcutaneous tissues or intramuscular regions to suggest abscess.  Subtle intramuscular edema terminates well above the patella.  Prominent right inguinal nodes at up to 1.1 cm, likely reactive.  No evidence of soft tissue gas.  Arterial structures are all patent.  No osseous abnormality to suggest osteomyelitis.  IMPRESSION:  1.  Skin thickening and subcutaneous edema lateral to the right thigh musculature, most consistent with cellulitis. 2.  Subtle edema suspected within the intramuscular planes of the lateral extensor musculature.  Mild  fasciitis cannot be excluded. No specific findings of necrotizing fasciitis such as abscess or soft tissue/intramuscular gas. 3.  MRI with contrast may be of increased sensitivity to evaluate the extent of inflammation.   Original Report Authenticated By: Jeronimo Greaves, M.D.    Anti-infectives: Anti-infectives   Start     Dose/Rate Route Frequency Ordered Stop   05/22/13 1800  vancomycin (VANCOCIN) 1,500 mg in sodium chloride 0.9 % 500 mL IVPB     1,500 mg 250 mL/hr over 120 Minutes Intravenous Every 12 hours 05/22/13 1537     05/22/13 1545   piperacillin-tazobactam (ZOSYN) IVPB 3.375 g     3.375 g 12.5 mL/hr over 240 Minutes Intravenous 3 times per day 05/22/13 1537     05/22/13 1200  vancomycin (VANCOCIN) IVPB 1000 mg/200 mL premix     1,000 mg 200 mL/hr over 60 Minutes Intravenous  Once 05/22/13 1153 05/22/13 1330       Assessment/Plan  1. Right thigh cellulitis secondary to insect bite  Plan: 1. Cont abx therapy.  This appears to be improving already after just 12 hrs of IV abx therapy.  Hopefully, this will continue to improve with no need for surgical intervention.  Will give him a diet. Will follow closely.  LOS: 1 day    Messiah Ahr E 05/23/2013, 9:14 AM Pager: [phone]

## 2013-05-24 DIAGNOSIS — L0291 Cutaneous abscess, unspecified: Secondary | ICD-10-CM

## 2013-05-24 MED ORDER — CEPHALEXIN 500 MG PO CAPS: ORAL_CAPSULE | ORAL | Status: DC

## 2013-05-24 MED ORDER — DOXYCYCLINE HYCLATE 50 MG PO CAPS: ORAL_CAPSULE | ORAL | Status: DC

## 2013-05-24 NOTE — Discharge Summary (Signed)
Physician Discharge Summary  DISCHARGE SUMMARY   Patient ID: Edwin Bishop MR#: [id number] DOB/AGE: August 26, 1966 47 y.o.   Attending Physician:Edwin Bishop  Patient's JXB:JYNWG,NFAO Bishop, Edwin  Consults:Treatment Team:  Edwin Edwin Morita, Edwin** ID  Admit date: 05/22/2013 Discharge date: 05/24/2013  Discharge Diagnoses:  Principal Problem:   Cellulitis   Patient Active Problem List   Diagnosis Date Noted  . Cellulitis 05/22/2013   Past Medical History  Diagnosis Date  . Cellulitis of right thigh ~ 05/18/2013    Discharged Condition: Stable   Discharge Medications:   Medication List    STOP taking these medications       sulfamethoxazole-trimethoprim 800-160 MG per tablet  Commonly known as:  SEPTRA DS      TAKE these medications       cephALEXin 500 MG capsule  Commonly known as:  KEFLEX  Take 1 capsule (500 mg total) by mouth 4 (four) times daily for 5 days.     doxycycline 50 MG capsule  Commonly known as:  VIBRAMYCIN  Take 2 capsules (100 mg total) by mouth 2 (two) times daily for 5 days - I called it into [facility] on [address].     fexofenadine 180 MG tablet  Commonly known as:  ALLEGRA  Take 180 mg by mouth daily as needed (allergies).     oxyCODONE-acetaminophen 5-325 MG per tablet  Commonly known as:  PERCOCET/ROXICET  Take one or two tablets every 4 to 6 hours as needed for severe pain - Do not take with Tylenol as this tablet already contains tylenol        Hospital Procedures: Ct Femur Right W Contrast  05/22/2013   *RADIOLOGY REPORT*  Clinical Data: Inflamed area of right hip/femur.  Fever.  Symptoms for 4 days.  Rule out necrotizing fasciitis.  CT OF THE RIGHT LOWER EXTREMITY WITH CONTRAST  Technique:  Continous axial images were obtained of the right femur after contrast administration.  Contrast: OMNIPAQUE IOHEXOL 300 MG/ML  SOLN  Comparison: None.  Findings: Imaging from the level of the acetabulum through the knee joint.  Soft tissue windows demonstrate   skin thickening and mild subcutaneous edema centered about the lateral aspect of the proximal right thigh musculature.  Example images 28 - 51/series 4. There is a suggestion of mild edema within the intramuscular planes of the lateral extensor musculature.  Example image 55/series 4. No significant enhancement in this area.  No fluid collections within the subcutaneous tissues or intramuscular regions to suggest abscess.  Subtle intramuscular edema terminates well above the patella.  Prominent right inguinal nodes at up to 1.1 cm, likely reactive.  No evidence of soft tissue gas.  Arterial structures are all patent.  No osseous abnormality to suggest osteomyelitis.  IMPRESSION:  1.  Skin thickening and subcutaneous edema lateral to the right thigh musculature, most consistent with cellulitis. 2.  Subtle edema suspected within the intramuscular planes of the lateral extensor musculature.  Mild  fasciitis cannot be excluded. No specific findings of necrotizing fasciitis such as abscess or soft tissue/intramuscular gas. 3.  MRI with contrast may be of increased sensitivity to evaluate the extent of inflammation.   Original Report Authenticated By: Edwin Bishop, Bishop.D.    History of Present Illness: Admitted through ED with R Thigh Cellulitis which started after a presumed Spider bite Edwin Bishop?).  The infection failed outpt UC and ED eval and RX c 2 Abx. The site was Red hot swollen and tender c streaking redness around the sharpy marks  made 24 hrs prior.  4 days prior to [place] something crawl up his leg and bite him. He did not see the insect that bit him. He was seen at urgent care and was given Bactrim. He was seen at the emergency room 1 day PTA and was given Keflex.  The pain was throbbing. He had fevers which he states the highest he has measured was 100.4. Blisters noted     Hospital Course: He was admitted c circular R Thigh Cellulitis related to  insect bite c ? Abscess.  [place] Ruled out abscess.  He was placed on Zosyn/Vanco. Surgery was consulted and did not believe he needed Debridement.  The IV Abx worked as he had less pain and less Streaky c some improvement overnight.  He remained on IV Abx for another 24 hrs and had markedly more improvement c Less pain, less swelling, more mobile, looks lots better. No N/V. Labs remained fine. The CT scan was done and showed right thigh cellulitis without abscess secondary to insect bite and it officially showed:  CT showed: 1. Skin thickening and subcutaneous edema lateral to the right thigh musculature, most consistent with cellulitis. 2. Subtle edema suspected within the intramuscular planes of the lateral extensor musculature. Mild fasciitis cannot be excluded. No specific findings of necrotizing fasciitis such as abscess or soft tissue/intramuscular gas. 3. MRI with contrast may be of increased sensitivity to evaluate the extent of inflammation.   We attempted Wound Care with Dressings.  Wound care saw pt and left recommendations.  The Blister did rupture yesterday.  He can go home and attempt to Dress it.  Wound care saw him today and: Right thigh wound improving. Erythremia and edema reducing in size. Blistered area in middle less moist. No slough or eschar has evolved at this time. Continue with present plan of care with warm soaks and protecting site with foam dressing for comfort. Discussed plan of care with patient if he discharges and S/S to return to physician if they occur. He appears well informed and denies further questions.    After Surgery eval today said: Cellulitis right thigh Improving on ABX  No acute surgical need at this point. Can transition to PO ABX per Dr Edwin Bishop and discharge when medically ready.   I called ID for eval of how long to keep on IV Abx, when and what oral Abx to use.  Dr Edwin Bishop Came by and left the following recs:    Discharge home on oral cephalexin 500 mg 4 times  daily and doxycycline 100 mg 2 times daily for 5 more days  Assessment:  Edwin Bishop probably has a soft tissue infection due to staph or strep bacteria. This would be much more likely than a brown Bishop spider bite. The nausea he failed outpatient treatment with cephalexin and trimethoprim sulfamethoxazole. His infection was probably progressing rapidly enough that no treatment was going to provide instant relief. He has had dramatic improvement over the last 48 hours on vancomycin and piperacillin tazobactam. I would favor sending him home and restarting of cephalexin to cover common strep and MSSA. I would add oral doxycycline to cover for the possibility of MRSA. I would treat him for at least 5 more days.   I called the pt and discussed these recs and he is excited @ D/c and follow up on Monday. All his labs have been fine.    Day of Discharge Exam BP 125/80  Pulse 60  Temp(Src) 98.6 F (37 C) (Oral)  Resp 18  Ht 6' (1.829 Bishop)  Wt 85.1 kg (187 lb 9.8 oz)  BMI 25.44 kg/m2  SpO2 96%  Physical Exam: See PN  Discharge Labs:  Recent Labs  05/22/13 1303 05/22/13 1643 05/23/13 0450  NA 135  --  136  K 3.8  --  4.1  CL 100  --  102  CO2 24  --  26  GLUCOSE 102*  --  82  BUN 12  --  10  CREATININE 1.24 1.09 1.09  CALCIUM 8.9  --  8.8    Recent Labs  05/22/13 1643 05/23/13 0450  AST 18 17  ALT 21 21  ALKPHOS 82 80  BILITOT 0.5 0.7  PROT 6.9 6.4  ALBUMIN 3.6 3.4*    Recent Labs  05/22/13 1303 05/22/13 1643 05/23/13 0450  WBC 9.6 9.3 8.8  NEUTROABS 6.8  --   --   HGB 15.1 14.1 14.0  HCT 42.2 41.1 40.6  MCV 88.3 89.7 88.6  PLT 203 197 183   No results found for this basename: CKTOTAL, CKMB, CKMBINDEX, TROPONINI,  in the last 72 hours No results found for this basename: TSH, T4TOTAL, FREET3, T3FREE, THYROIDAB,  in the last 72 hours No results found for this basename: VITAMINB12, FOLATE, FERRITIN, TIBC, IRON, RETICCTPCT,  in the last 72 hours No results found  for this basename: INR, PROTIME       Discharge instructions:  01-Home or Self Care     Follow-up Information   Follow up In 1 day. (your doctor)       Follow up with Gwen Pounds, Edwin In 5 days.   Contact information:   [address] [facility] [place] [address] [phone]        Disposition: Home  Follow-up Appts: Follow-up with Dr. Timothy Bishop at [facility] in 1 week.  Call for appointment.  Condition on Discharge: stable  Tests Needing Follow-up: None  Time spent in discharge (includes decision making & examination of pt): 25 min  Signed: Lekesha Claw Bishop 05/24/2013, 12:59 PM

## 2013-05-24 NOTE — Progress Notes (Signed)
Subjective: F/Up R Thigh Cellulitis. Less pain, less swelling, more mobile, looks lots better No N/V  Objective: Vital signs in last 24 hours: Temp:  [98.3 F (36.8 C)-98.6 F (37 C)] 98.6 F (37 C) (07/09 0529) Pulse Rate:  [60-98] 60 (07/09 0452) Resp:  [18] 18 (07/09 0452) BP: (125-137)/(80-91) 125/80 mmHg (07/09 0452) SpO2:  [96 %-98 %] 96 % (07/09 0452) Weight:  [85.1 kg (187 lb 9.8 oz)] 85.1 kg (187 lb 9.8 oz) (07/09 0529) Weight change: -7.887 kg (-17 lb 6.2 oz) Last BM Date: 05/22/13  CBG (last 3)  No results found for this basename: GLUCAP,  in the last 72 hours  Intake/Output from previous day:  Intake/Output Summary (Last 24 hours) at 05/24/13 0755 Last data filed at 05/23/13 1729  Gross per 24 hour  Intake   1000 ml  Output      0 ml  Net   1000 ml   07/08 0701 - 07/09 0700 In: 1000 [P.O.:400; IV Piggyback:600] Out: -    Physical Exam  General appearance: A and O Extremities: R Thigh Hip - necrotic circular Cellulitis with central Blister and surrounding redness - better than yesterday.  Blister has popped.  Less hard.  Less angry. R Groin LN noted but smaller.   Lab Results:  Recent Labs  05/22/13 1303 05/22/13 1643 05/23/13 0450  NA 135  --  136  K 3.8  --  4.1  CL 100  --  102  CO2 24  --  26  GLUCOSE 102*  --  82  BUN 12  --  10  CREATININE 1.24 1.09 1.09  CALCIUM 8.9  --  8.8     Recent Labs  05/22/13 1643 05/23/13 0450  AST 18 17  ALT 21 21  ALKPHOS 82 80  BILITOT 0.5 0.7  PROT 6.9 6.4  ALBUMIN 3.6 3.4*     Recent Labs  05/22/13 1303 05/22/13 1643 05/23/13 0450  WBC 9.6 9.3 8.8  NEUTROABS 6.8  --   --   HGB 15.1 14.1 14.0  HCT 42.2 41.1 40.6  MCV 88.3 89.7 88.6  PLT 203 197 183    No results found for this basename: INR,  PROTIME    No results found for this basename: CKTOTAL, CKMB, CKMBINDEX, TROPONINI,  in the last 72 hours  No results found for this basename: TSH, T4TOTAL, FREET3, T3FREE, THYROIDAB,  in  the last 72 hours  No results found for this basename: VITAMINB12, FOLATE, FERRITIN, TIBC, IRON, RETICCTPCT,  in the last 72 hours  Micro Results: No results found for this or any previous visit (from the past 240 hour(s)).   Studies/Results: Ct Femur Right W Contrast  05/22/2013   *RADIOLOGY REPORT*  Clinical Data: Inflamed area of right hip/femur.  Fever.  Symptoms for 4 days.  Rule out necrotizing fasciitis.  CT OF THE RIGHT LOWER EXTREMITY WITH CONTRAST  Technique:  Continous axial images were obtained of the right femur after contrast administration.  Contrast: OMNIPAQUE IOHEXOL 300 MG/ML  SOLN  Comparison: None.  Findings: Imaging from the level of the acetabulum through the knee joint.  Soft tissue windows demonstrate  skin thickening and mild subcutaneous edema centered about the lateral aspect of the proximal right thigh musculature.  Example images 28 - 51/series 4. There is a suggestion of mild edema within the intramuscular planes of the lateral extensor musculature.  Example image 55/series 4. No significant enhancement in this area.  No fluid collections within the  subcutaneous tissues or intramuscular regions to suggest abscess.  Subtle intramuscular edema terminates well above the patella.  Prominent right inguinal nodes at up to 1.1 cm, likely reactive.  No evidence of soft tissue gas.  Arterial structures are all patent.  No osseous abnormality to suggest osteomyelitis.  IMPRESSION:  1.  Skin thickening and subcutaneous edema lateral to the right thigh musculature, most consistent with cellulitis. 2.  Subtle edema suspected within the intramuscular planes of the lateral extensor musculature.  Mild  fasciitis cannot be excluded. No specific findings of necrotizing fasciitis such as abscess or soft tissue/intramuscular gas. 3.  MRI with contrast may be of increased sensitivity to evaluate the extent of inflammation.   Original Report Authenticated By: Jeronimo Greaves, M.D.      Medications: Scheduled: . enoxaparin (LOVENOX) injection  40 mg Subcutaneous Q24H  . piperacillin-tazobactam (ZOSYN)  IV  3.375 g Intravenous Q8H  . sodium chloride  3 mL Intravenous Q12H  . vancomycin  1,500 mg Intravenous Q12H   Continuous: . sodium chloride 75 mL/hr at 05/22/13 1616     Assessment/Plan: Principal Problem:   Cellulitis  Right thigh cellulitis without abscess secondary to insect bite   IV Vncomycin and Zosyn. Pain control. CT showed:  1.  Skin thickening and subcutaneous edema lateral to the right thigh musculature, most consistent with cellulitis. 2.  Subtle edema suspected within the intramuscular planes of the lateral extensor musculature.  Mild  fasciitis cannot be excluded. No specific findings of necrotizing fasciitis such as abscess or soft tissue/intramuscular gas. 3.  MRI with contrast may be of increased sensitivity to evaluate the extent of inflammation.   Wound Care. Dress. Await Surgery re-eval.  The pt and I discussed options.  We want to keep him on IV Abx for 24 more hrs and consider D/c tomorrow especially if surgery agrees.  Options are to switch to Doxy/Bactrim/or Clinda vrs D/c on IV Vanco and Wife who is an ED nurse can help with this and do it via PIV (we certainly could do a PICC if needed).  Will ask ID for their input as well.  ID -  Anti-infectives   Start     Dose/Rate Route Frequency Ordered Stop   05/22/13 1800  vancomycin (VANCOCIN) 1,500 mg in sodium chloride 0.9 % 500 mL IVPB     1,500 mg 250 mL/hr over 120 Minutes Intravenous Every 12 hours 05/22/13 1537     05/22/13 1545  piperacillin-tazobactam (ZOSYN) IVPB 3.375 g     3.375 g 12.5 mL/hr over 240 Minutes Intravenous 3 times per day 05/22/13 1537     05/22/13 1200  vancomycin (VANCOCIN) IVPB 1000 mg/200 mL premix     1,000 mg 200 mL/hr over 60 Minutes Intravenous  Once 05/22/13 1153 05/22/13 1330     DVT Prophylaxis    LOS: 2 days   Ashlan Dignan M 05/24/2013, 7:55  AM

## 2013-05-24 NOTE — Consult Note (Signed)
WOC follow-up: Right thigh wound improving.  Erythremia and edema reducing in size.  Blistered area in middle less moist.  No slough or eschar has evolved at this time.  Continue with present plan of care with warm soaks and protecting site with foam dressing for comfort.  Discussed plan of care with patient if he discharges and S/S to return to physician if they occur.  He appears well informed and denies further questions.  Please re-consult if further assistance is needed.  Thank-you,  Cammie Mcgee MSN, RN, CWOCN, [place], CNS [phone]

## 2013-05-24 NOTE — Progress Notes (Signed)
Subjective: Feels better.  Less pain in right thigh  Objective: Vital signs in last 24 hours: Temp:  [98.3 F (36.8 C)-98.6 F (37 C)] 98.6 F (37 C) (07/09 0529) Pulse Rate:  [60-98] 60 (07/09 0452) Resp:  [18] 18 (07/09 0452) BP: (125-137)/(80-91) 125/80 mmHg (07/09 0452) SpO2:  [96 %-98 %] 96 % (07/09 0452) Weight:  [187 lb 9.8 oz (85.1 kg)] 187 lb 9.8 oz (85.1 kg) (07/09 0529) Last BM Date: 05/22/13  Intake/Output from previous day: 07/08 0701 - 07/09 0700 In: 1000 [P.O.:400; IV Piggyback:600] Out: -  Intake/Output this shift:    right thigh with less erythema.  blister is drying up.  no necrosis.  less tender and not as indurated  Lab Results:   Recent Labs  05/22/13 1643 05/23/13 0450  WBC 9.3 8.8  HGB 14.1 14.0  HCT 41.1 40.6  PLT 197 183   BMET  Recent Labs  05/22/13 1303 05/22/13 1643 05/23/13 0450  NA 135  --  136  K 3.8  --  4.1  CL 100  --  102  CO2 24  --  26  GLUCOSE 102*  --  82  BUN 12  --  10  CREATININE 1.24 1.09 1.09  CALCIUM 8.9  --  8.8   PT/INR No results found for this basename: LABPROT, INR,  in the last 72 hours ABG No results found for this basename: PHART, PCO2, PO2, HCO3,  in the last 72 hours  Studies/Results: Ct Femur Right W Contrast  05/22/2013   *RADIOLOGY REPORT*  Clinical Data: Inflamed area of right hip/femur.  Fever.  Symptoms for 4 days.  Rule out necrotizing fasciitis.  CT OF THE RIGHT LOWER EXTREMITY WITH CONTRAST  Technique:  Continous axial images were obtained of the right femur after contrast administration.  Contrast: OMNIPAQUE IOHEXOL 300 MG/ML  SOLN  Comparison: None.  Findings: Imaging from the level of the acetabulum through the knee joint.  Soft tissue windows demonstrate  skin thickening and mild subcutaneous edema centered about the lateral aspect of the proximal right thigh musculature.  Example images 28 - 51/series 4. There is a suggestion of mild edema within the intramuscular planes of the  lateral extensor musculature.  Example image 55/series 4. No significant enhancement in this area.  No fluid collections within the subcutaneous tissues or intramuscular regions to suggest abscess.  Subtle intramuscular edema terminates well above the patella.  Prominent right inguinal nodes at up to 1.1 cm, likely reactive.  No evidence of soft tissue gas.  Arterial structures are all patent.  No osseous abnormality to suggest osteomyelitis.  IMPRESSION:  1.  Skin thickening and subcutaneous edema lateral to the right thigh musculature, most consistent with cellulitis. 2.  Subtle edema suspected within the intramuscular planes of the lateral extensor musculature.  Mild  fasciitis cannot be excluded. No specific findings of necrotizing fasciitis such as abscess or soft tissue/intramuscular gas. 3.  MRI with contrast may be of increased sensitivity to evaluate the extent of inflammation.   Original Report Authenticated By: Jeronimo Greaves, M.D.    Anti-infectives: Anti-infectives   Start     Dose/Rate Route Frequency Ordered Stop   05/22/13 1800  vancomycin (VANCOCIN) 1,500 mg in sodium chloride 0.9 % 500 mL IVPB     1,500 mg 250 mL/hr over 120 Minutes Intravenous Every 12 hours 05/22/13 1537     05/22/13 1545  piperacillin-tazobactam (ZOSYN) IVPB 3.375 g     3.375 g 12.5 mL/hr over 240  Minutes Intravenous 3 times per day 05/22/13 1537     05/22/13 1200  vancomycin (VANCOCIN) IVPB 1000 mg/200 mL premix     1,000 mg 200 mL/hr over 60 Minutes Intravenous  Once 05/22/13 1153 05/22/13 1330      Assessment/Plan: Cellulitis right thigh  Improving on ABX No acute surgical need at this point.  Can transition to PO ABX per Dr Timothy Lasso and discharge when medically ready.  LOS: 2 days    Edwin Bishop A. 05/24/2013

## 2013-05-24 NOTE — Consult Note (Signed)
[facility]    Date of Admission:  05/22/2013               Total days of antibiotics 6        Day 3 vancomycin        Day 3 piperacillin tazobactam               Reason for Consult: Right thigh soft tissue infection    Referring Physician: Dr. Creola Corn  Principal Problem:   Cellulitis   . enoxaparin (LOVENOX) injection  40 mg Subcutaneous Q24H  . piperacillin-tazobactam (ZOSYN)  IV  3.375 g Intravenous Q8H  . sodium chloride  3 mL Intravenous Q12H  . vancomycin  1,500 mg Intravenous Q12H    Recommendations: 1. Discharge home on oral cephalexin 500 mg 4 times daily and doxycycline 100 mg 2 times daily for 5 more days   Assessment: Edwin Bishop probably has a soft tissue infection due to staph or strep bacteria. This would be much more likely than a brown recluse spider bite. The nausea he failed outpatient treatment with cephalexin and trimethoprim sulfamethoxazole. His infection was probably progressing rapidly enough that no treatment was going to provide instant relief. He has had dramatic improvement over the last 48 hours on vancomycin and piperacillin tazobactam. I would favor sending him home and restarting of cephalexin to cover common strep and MSSA. I would add oral doxycycline to cover for the possibility of MRSA. I would treat him for at least 5 more days.   HPI: Edwin Bishop is a 47 y.o. male who was previously healthy until he noticed a tender area on his right lateral thigh 5 days ago. 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 He leaned against something and noticed discomfort in his thigh. When he looked at the area was about a golf ball-sized area of redness on his right lateral thigh. He had some associated low-grade fevers up to 100.2 with some mild chills. On July 5 he was seen at [facility] and was started on empiric cephalexin. He took about 4  doses and did not feel he was getting any better. The lesion was enlarging and he was beginning to develop some redness and swelling in his right groin. He came to the emergency department here on July 6. He was not febrile at the time. An ultrasound did not show any drainable fluid collections. Trimethoprim sulfamethoxazole was added to his cephalexin. He continues to notice some enlargement of the lesions and was admitted on July 7. He was to her on empiric vancomycin and piperacillin tazobactam. At that time he developed some superficial blistering. He was seen by general surgery did not feel he needed immediate incision and drainage. He is noted marked improvement in how he feels and the lesion is shrinking in size.   Review of Systems: Constitutional: positive for chills and fevers, negative for anorexia, sweats and weight loss Eyes: negative Ears, nose, mouth, throat, and face: negative Respiratory: negative Cardiovascular: negative Gastrointestinal: negative Genitourinary:negative Integument/breast: negative, he has no previous history of boils, abscesses or other unusual skin infections.  Past Medical History  Diagnosis Date  . Cellulitis of right thigh ~ 05/18/2013    History  Substance Use Topics  . Smoking status: Never Smoker   . Smokeless tobacco: Never Used  . Alcohol Use: Yes  Comment: 05/22/2013 "0-2 alcoholic drinks/wk; most of the time it's 0"    History reviewed. No pertinent family history. No Known Allergies  OBJECTIVE: Blood pressure 125/80, pulse 60, temperature 98.6 F (37 C), temperature source Oral, resp. rate 18, height 6' (1.829 m), weight 85.1 kg (187 lb 9.8 oz), SpO2 96.00%. General: He is in good spirits and in no distress. Skin: There is an area of erythema and induration on his right lateral thigh that is inside of the previously inked were drawn on admission. There is no fluctuance or drainage. It is only minimally tender to palpation. He has no right  inguinal adenopathy. Lungs: Clear Cor: Regular S1 and S2 no murmurs Abdomen: Soft and nontender   Microbiology: No results found for this or any previous visit (from the past 240 hour(s)).  Cliffton Asters, MD [facility] [phone] pager   [phone] cell 05/24/2013, 11:26 AM

## 2013-05-24 NOTE — Progress Notes (Signed)
   CARE MANAGEMENT NOTE 05/24/2013  Patient:  Edwin Bishop, Edwin Bishop   Account Number:  [id number]  Date Initiated:  05/24/2013  Documentation initiated by:  Letha Cape  Subjective/Objective Assessment:   dx cellulitis  admit- lives with spouse. pta indep.     Action/Plan:   Anticipated DC Date:  05/24/2013   Anticipated DC Plan:  HOME/SELF CARE      DC Planning Services  CM consult      Choice offered to / List presented to:             Status of service:  Completed, signed off Medicare Important Message given?   (If response is "NO", the following Medicare IM given date fields will be blank) Date Medicare IM given:   Date Additional Medicare IM given:    Discharge Disposition:  HOME/SELF CARE  Per UR Regulation:  Reviewed for med. necessity/level of care/duration of stay  If discussed at Long Length of Stay Meetings, dates discussed:    Comments:  05/24/13 Letha Cape RN, BSN [phone] patient lives with spouse, pta indep.  No needs anticipated.  Patient for dc today.

## 2013-05-24 NOTE — Progress Notes (Signed)
NURSING PROGRESS NOTE  Edwin Bishop [id number] Discharge Data: 05/24/2013 1:30 PM Attending Provider: Gwen Pounds, MD YQM:VHQIO,NGEX Judie Petit, MD     Peggyann Shoals to be D/C'd Home per MD order.  Discussed with the patient the After Visit Summary and all questions fully answered. All IV's discontinued with no bleeding noted. All belongings returned to patient for patient to take home.   Last Vital Signs:  Blood pressure 125/80, pulse 60, temperature 98.6 F (37 C), temperature source Oral, resp. rate 18, height 6' (1.829 m), weight 85.1 kg (187 lb 9.8 oz), SpO2 96.00%.  Discharge Medication List   Medication List    STOP taking these medications       sulfamethoxazole-trimethoprim 800-160 MG per tablet  Commonly known as:  SEPTRA DS      TAKE these medications       cephALEXin 500 MG capsule  Commonly known as:  KEFLEX  Take 1 capsule (500 mg total) by mouth 4 (four) times daily for 5 days.     doxycycline 50 MG capsule  Commonly known as:  VIBRAMYCIN  Take 2 capsules (100 mg total) by mouth 2 (two) times daily for 5 days - I called it into [facility] [address].     fexofenadine 180 MG tablet  Commonly known as:  ALLEGRA  Take 180 mg by mouth daily as needed (allergies).     oxyCODONE-acetaminophen 5-325 MG per tablet  Commonly known as:  PERCOCET/ROXICET  Take one or two tablets every 4 to 6 hours as needed for severe pain - Do not take with Tylenol as this tablet already contains tylenol

## 2013-05-24 NOTE — Discharge Instructions (Signed)
Cellulitis °Cellulitis is an infection of the skin and the tissue under the skin. The infected area is usually red and tender. This happens most often in the arms and lower legs. °HOME CARE  °· Take your antibiotic medicine as told. Finish the medicine even if you start to feel better. °· Keep the infected arm or leg raised (elevated). °· Put a warm cloth on the area up to 4 times per day. °· Only take medicines as told by your doctor. °· Keep all doctor visits as told. °GET HELP RIGHT AWAY IF:  °· You have a fever. °· You feel very sleepy. °· You throw up (vomit) or have watery poop (diarrhea). °· You feel sick and have muscle aches and pains. °· You see red streaks on the skin coming from the infected area. °· Your red area gets bigger or turns a dark color. °· Your bone or joint under the infected area is painful after the skin heals. °· Your infection comes back in the same area or different area. °· You have a puffy (swollen) bump in the infected area. °· You have new symptoms. °MAKE SURE YOU:  °· Understand these instructions. °· Will watch your condition. °· Will get help right away if you are not doing well or get worse. °Document Released: 04/20/2008 Document Revised: 05/03/2012 Document Reviewed: 01/18/2012 °ExitCare® Patient Information ©2014 [employer]. ° °

## 2013-07-31 ENCOUNTER — Emergency Department (HOSPITAL_COMMUNITY)
Admission: EM | Admit: 2013-07-31 | Discharge: 2013-07-31 | Disposition: A | Discharge status: Home / Self Care | Payer: BC Managed Care – PPO | Attending: Emergency Medicine | Admitting: Emergency Medicine

## 2013-07-31 ENCOUNTER — Encounter (HOSPITAL_COMMUNITY): Payer: Self-pay

## 2013-07-31 ENCOUNTER — Emergency Department (HOSPITAL_COMMUNITY): Payer: BC Managed Care – PPO

## 2013-07-31 DIAGNOSIS — Z79899 Other long term (current) drug therapy: Secondary | ICD-10-CM | POA: Insufficient documentation

## 2013-07-31 DIAGNOSIS — F419 Anxiety disorder, unspecified: Secondary | ICD-10-CM

## 2013-07-31 DIAGNOSIS — R209 Unspecified disturbances of skin sensation: Secondary | ICD-10-CM

## 2013-07-31 DIAGNOSIS — M542 Cervicalgia: Secondary | ICD-10-CM

## 2013-07-31 DIAGNOSIS — F411 Generalized anxiety disorder: Secondary | ICD-10-CM | POA: Insufficient documentation

## 2013-07-31 DIAGNOSIS — R531 Weakness: Secondary | ICD-10-CM

## 2013-07-31 DIAGNOSIS — Z792 Long term (current) use of antibiotics: Secondary | ICD-10-CM | POA: Insufficient documentation

## 2013-07-31 DIAGNOSIS — Z872 Personal history of diseases of the skin and subcutaneous tissue: Secondary | ICD-10-CM | POA: Insufficient documentation

## 2013-07-31 DIAGNOSIS — R5381 Other malaise: Secondary | ICD-10-CM | POA: Insufficient documentation

## 2013-07-31 LAB — POCT I-STAT, CHEM 8
Hemoglobin: 15.6 g/dL (ref 13.0–17.0)
Sodium: 141 mEq/L (ref 135–145)
TCO2: 25 mmol/L (ref 0–100)

## 2013-07-31 LAB — GLUCOSE, CAPILLARY: Glucose-Capillary: 93 mg/dL (ref 70–99)

## 2013-07-31 LAB — URINALYSIS, ROUTINE W REFLEX MICROSCOPIC
Glucose, UA: NEGATIVE mg/dL
Leukocytes, UA: NEGATIVE
Specific Gravity, Urine: 1.014 (ref 1.005–1.030)
pH: 6.5 (ref 5.0–8.0)

## 2013-07-31 LAB — CBC
HCT: 43.2 % (ref 39.0–52.0)
Hemoglobin: 15.2 g/dL (ref 13.0–17.0)
MCV: 87.6 fL (ref 78.0–100.0)
Platelets: 225 10*3/uL (ref 150–400)
RBC: 4.93 MIL/uL (ref 4.22–5.81)
WBC: 7.4 10*3/uL (ref 4.0–10.5)

## 2013-07-31 LAB — POCT I-STAT TROPONIN I: Troponin i, poc: 0 ng/mL (ref 0.00–0.08)

## 2013-07-31 LAB — DIFFERENTIAL
Eosinophils Relative: 3 % (ref 0–5)
Lymphocytes Relative: 21 % (ref 12–46)
Lymphs Abs: 1.6 10*3/uL (ref 0.7–4.0)

## 2013-07-31 LAB — COMPREHENSIVE METABOLIC PANEL
ALT: 27 U/L (ref 0–53)
Alkaline Phosphatase: 82 U/L (ref 39–117)
CO2: 25 mEq/L (ref 19–32)
Calcium: 9.1 mg/dL (ref 8.4–10.5)
GFR calc Af Amer: >90 mL/min (ref >90)
GFR calc non Af Amer: >90 mL/min (ref >90)
Glucose, Bld: 90 mg/dL (ref 70–99)
Sodium: 137 mEq/L (ref 135–145)

## 2013-07-31 LAB — PROTIME-INR
INR: 0.94 (ref 0.00–1.49)
Prothrombin Time: 12.4 seconds (ref 11.6–15.2)

## 2013-07-31 LAB — ETHANOL: Alcohol, Ethyl (B): <11 mg/dL (ref 0–11)

## 2013-07-31 LAB — RAPID URINE DRUG SCREEN, HOSP PERFORMED: Benzodiazepines: NOT DETECTED

## 2013-07-31 MED ORDER — IOHEXOL 350 MG/ML SOLN
100.0000 mL | Freq: Once | INTRAVENOUS | Status: AC | PRN
  Administered 2013-07-31: 100 mL via INTRAVENOUS

## 2013-07-31 MED ORDER — METOCLOPRAMIDE HCL 5 MG/ML IJ SOLN
10.0000 mg | Freq: Once | INTRAMUSCULAR | Status: AC
  Administered 2013-07-31: 10 mg via INTRAVENOUS
  Filled 2013-07-31: qty 2

## 2013-07-31 MED ORDER — FENTANYL CITRATE 0.05 MG/ML IJ SOLN
50.0000 ug | Freq: Once | INTRAMUSCULAR | Status: AC
  Administered 2013-07-31: 50 ug via INTRAVENOUS
  Filled 2013-07-31: qty 2

## 2013-07-31 MED ORDER — ASPIRIN 81 MG PO CHEW
324.0000 mg | CHEWABLE_TABLET | Freq: Once | ORAL | Status: AC
  Administered 2013-07-31: 324 mg via ORAL
  Filled 2013-07-31: qty 4

## 2013-07-31 MED ORDER — MORPHINE SULFATE 4 MG/ML IJ SOLN
4.0000 mg | Freq: Once | INTRAMUSCULAR | Status: AC
  Administered 2013-07-31: 4 mg via INTRAVENOUS
  Filled 2013-07-31: qty 1

## 2013-07-31 MED ORDER — ONDANSETRON HCL 4 MG/2ML IJ SOLN
4.0000 mg | Freq: Once | INTRAMUSCULAR | Status: AC
  Administered 2013-07-31: 4 mg via INTRAVENOUS
  Filled 2013-07-31: qty 2

## 2013-07-31 MED ORDER — ALPRAZOLAM 0.25 MG PO TABS: 0.2500 mg | ORAL_TABLET | Freq: Three times a day (TID) | ORAL | Status: DC | PRN

## 2013-07-31 NOTE — ED Provider Notes (Signed)
CSN: [id number]     Arrival date & time 07/31/13  0930 History   First MD Initiated Contact with Patient 07/31/13 [phone]     Chief Complaint  Patient presents with  . Numbness  . Weakness   (Consider location/radiation/quality/duration/timing/severity/associated sxs/prior Treatment) Patient is a 47 y.o. male presenting with weakness.  Weakness   Pt reports he was standing in line at the bank about an hour prior arrival when he began to feel numbness on the left side of his body including face, tongue, arm and upper part of leg. He had a sharp pain in his L neck which resolved after a few minutes. He states symptoms lasted for 10-48min and then improved. He reportedly had some weakness in L arm as well that has since resolved. He felt lightheaded and dizzy while at the bank with brief episode of tunnel-vision which has also since resolved. Drove himself to the ED for evaluation. Wife at bedside states his voice sounded different on the phone like he had a thick tongue but no definite slurred speech and no aphasia. He reports 'sinus headache' the last few days.   Past Medical History  Diagnosis Date  . Cellulitis of right thigh ~ 05/18/2013   Past Surgical History  Procedure Laterality Date  . Shoulder arthroscopy w/ rotator cuff repair Left 2014  . Mandible fracture surgery  1988   History reviewed. No pertinent family history. History  Substance Use Topics  . Smoking status: Never Smoker   . Smokeless tobacco: Never Used  . Alcohol Use: Yes     Comment: 05/22/2013 "0-2 alcoholic drinks/wk; most of the time it's 0"    Review of Systems  Neurological: Positive for weakness.   All other systems reviewed and are negative except as noted in HPI.   Allergies  Review of patient's allergies indicates no known allergies.  Home Medications   Current Outpatient Rx  Name  Route  Sig  Dispense  Refill  . cephALEXin (KEFLEX) 500 MG capsule      Take 1 capsule (500 mg total) by mouth 4  (four) times daily for 5 days.         Marland Kitchen doxycycline (VIBRAMYCIN) 50 MG capsule      Take 2 capsules (100 mg total) by mouth 2 (two) times daily for 5 days - I called it into [facility] on [address].   20 capsule   0   . fexofenadine (ALLEGRA) 180 MG tablet   Oral   Take 180 mg by mouth daily as needed (allergies).         Marland Kitchen oxyCODONE-acetaminophen (PERCOCET/ROXICET) 5-325 MG per tablet      Take one or two tablets every 4 to 6 hours as needed for severe pain - Do not take with Tylenol as this tablet already contains tylenol   10 tablet   0    BP 122/93  Pulse 87  Temp(Src) 97.1 F (36.2 C) (Oral)  Resp 20  SpO2 98% Physical Exam  Nursing note and vitals reviewed. Constitutional: He is oriented to person, place, and time. He appears well-developed and well-nourished.  HENT:  Head: Normocephalic and atraumatic.  Eyes: EOM are normal. Pupils are equal, round, and reactive to light.  Neck: Normal range of motion. Neck supple.  Cardiovascular: Normal rate, normal heart sounds and intact distal pulses.   Pulmonary/Chest: Effort normal and breath sounds normal.  Abdominal: Bowel sounds are normal. He exhibits no distension. There is no tenderness.  Musculoskeletal: Normal range of motion.  He exhibits no edema and no tenderness.  Neurological: He is alert and oriented to person, place, and time. He has normal strength and normal reflexes. No cranial nerve deficit or sensory deficit. Coordination normal.  Skin: Skin is warm and dry. No rash noted.  Psychiatric: He has a normal mood and affect.    ED Course  Procedures (including critical care time) Labs Review Labs Reviewed  URINE RAPID DRUG SCREEN (HOSP PERFORMED) - Abnormal; Notable for the following:    Tetrahydrocannabinol POSITIVE (*)    All other components within normal limits  ETHANOL  PROTIME-INR  APTT  CBC  DIFFERENTIAL  COMPREHENSIVE METABOLIC PANEL  URINALYSIS, ROUTINE W REFLEX MICROSCOPIC  GLUCOSE,  CAPILLARY  POCT I-STAT, CHEM 8  POCT I-STAT TROPONIN I   Imaging Review Ct Angio Head W/cm &/or Wo Cm  07/31/2013   CLINICAL DATA:  Numbness and weakness. Right-sided neck pain.  EXAM: CT ANGIOGRAPHY HEAD AND NECK  TECHNIQUE: Multidetector CT imaging of the head and neck was performed using the standard protocol during bolus administration of intravenous contrast. Multiplanar CT image reconstructions including MIPs were obtained to evaluate the vascular anatomy. Carotid stenosis measurements (when applicable) are obtained utilizing NASCET criteria, using the distal internal carotid diameter as the denominator.  CONTRAST:  OMNIPAQUE IOHEXOL 350 MG/ML SOLN  COMPARISON:  Head CT same day  FINDINGS: CTA HEAD FINDINGS  Both internal carotid arteries are widely patent through the siphon regions. The anterior and middle cerebral vessels are normal without proximal stenosis, aneurysm or vascular malformation. Both vertebral arteries are patent to the basilar. No basilar stenosis. Posterior circulation branch vessels are normal.  Intracranial veins are normal.  No parenchymal brain lesion evident.  Review of the MIP images confirms the above findings.  CTA NECK FINDINGS  Lung apices are clear. Superior mediastinum is unremarkable. Branching pattern of the brachiocephalic vessels in the arch is normal. No origin stenoses. Both common carotid arteries are widely patent to their respective bifurcation. Both carotid bifurcations are normal. Both cervical internal carotid arteries are normal.  Both vertebral artery origins are normal. Both vertebral arteries are widely patent through the neck.  No soft tissue lesion is seen. There is or nares cervical spondylosis.  Review of the MIP images confirms the above findings.  IMPRESSION: CTA HEAD IMPRESSION  Normal intracranial CT angiography. No evidence of dissection or vascular occlusion.  CTA NECK IMPRESSION  Normal CT of the neck vessels. No evidence of dissection or  atherosclerotic disease.   Electronically Signed   By: Paulina Fusi M.D.   On: 07/31/2013 12:58   Ct Head Wo Contrast  07/31/2013   CLINICAL DATA:  Left-sided numbness and weakness; headache, dizziness, and blurred vision  EXAM: CT HEAD WITHOUT CONTRAST  TECHNIQUE: Contiguous axial images were obtained from the base of the skull through the vertex without intravenous contrast. Study was obtained within 24 hr of patient's arrival to the emergency department.  COMPARISON:  None.  FINDINGS: The ventricles are normal in size and configuration. There is no mass, hemorrhage, extra-axial fluid collection, or midline shift. The gray and white compartments are normal. There is no demonstrable acute infarct. Bony calvarium appears intact. Mastoid air cells are clear.  IMPRESSION: Study within normal limits.   Electronically Signed   By: Bretta Bang   On: 07/31/2013 10:59   Ct Angio Neck W/cm &/or Wo/cm  07/31/2013   CLINICAL DATA:  Numbness and weakness. Right-sided neck pain.  EXAM: CT ANGIOGRAPHY HEAD AND NECK  TECHNIQUE: Multidetector CT imaging of the head and neck was performed using the standard protocol during bolus administration of intravenous contrast. Multiplanar CT image reconstructions including MIPs were obtained to evaluate the vascular anatomy. Carotid stenosis measurements (when applicable) are obtained utilizing NASCET criteria, using the distal internal carotid diameter as the denominator.  CONTRAST:  OMNIPAQUE IOHEXOL 350 MG/ML SOLN  COMPARISON:  Head CT same day  FINDINGS: CTA HEAD FINDINGS  Both internal carotid arteries are widely patent through the siphon regions. The anterior and middle cerebral vessels are normal without proximal stenosis, aneurysm or vascular malformation. Both vertebral arteries are patent to the basilar. No basilar stenosis. Posterior circulation branch vessels are normal.  Intracranial veins are normal.  No parenchymal brain lesion evident.  Review of the MIP  images confirms the above findings.  CTA NECK FINDINGS  Lung apices are clear. Superior mediastinum is unremarkable. Branching pattern of the brachiocephalic vessels in the arch is normal. No origin stenoses. Both common carotid arteries are widely patent to their respective bifurcation. Both carotid bifurcations are normal. Both cervical internal carotid arteries are normal.  Both vertebral artery origins are normal. Both vertebral arteries are widely patent through the neck.  No soft tissue lesion is seen. There is or nares cervical spondylosis.  Review of the MIP images confirms the above findings.  IMPRESSION: CTA HEAD IMPRESSION  Normal intracranial CT angiography. No evidence of dissection or vascular occlusion.  CTA NECK IMPRESSION  Normal CT of the neck vessels. No evidence of dissection or atherosclerotic disease.   Electronically Signed   By: Paulina Fusi M.D.   On: 07/31/2013 12:58    MDM  No diagnosis found.   Date: 07/31/2013  Rate: 91  Rhythm: normal sinus rhythm  QRS Axis: normal  Intervals: normal  ST/T Wave abnormalities: normal  Conduction Disutrbances:none  Narrative Interpretation:   Old EKG Reviewed: none available  Pt's initial CT negative. On re-eval now complaining of severe R neck pain. Has been ongoing for several days but worse since arrival. Sent for CTA to rule out dissection which is negative. Discussed with Dr. Leroy Kennedy on call for Neurology who will come to consult. Also discussed with Dr. Timothy Lasso who is the patient's PCP and he would like to wait until Neuro consult has been done before patient is to be admitted.     Charles B. Bernette Mayers, MD 07/31/13 2015

## 2013-07-31 NOTE — ED Notes (Signed)
Pt actively vomiting. MD aware.

## 2013-07-31 NOTE — ED Provider Notes (Signed)
Dr. Cyril Mourning from neuro evaluated. Symptoms resolved. CTA and MRI neg. Recommend outpatient f/u. He said that he is under a lot of stress recently. I called Dr. Virgel Manifold from [facility] medical to up date him. I recommend prn xanax for anxiety and Dr. Virgel Manifold agreed. Patient will call and make an appointment for later this week.   Richardean Canal, MD 07/31/13 [phone]

## 2013-07-31 NOTE — ED Notes (Signed)
MD at bedside. 

## 2013-07-31 NOTE — ED Notes (Signed)
Pt c/o L side numbness and tingling from "just below L ear, L arm and down to L hip" and weakness in L arm.  Pain 7/10.  Pt sts pain is intermittent.   Pt sts "it all started with my vision going blurry and I felt like I was going to pass out."  Sts vision has resolved.  Vitals are stable.  A & Ox4.

## 2013-07-31 NOTE — ED Notes (Signed)
Pt ambulated to bathroom for urine sample.

## 2013-07-31 NOTE — Consult Note (Signed)
NEURO HOSPITALIST CONSULT NOTE    Reason for Consult: left sided numbness, heaviness left arm.  HPI:                                                                                                                                          Edwin Bishop is an 47 y.o. male without significant past medical history who was in his usual state of health until 1 hour prior to arrival to [facility] ED when he developed abrupt onset of numbness-tingling of the left arm associated with a sensation of heaviness/inbility to use the left arm while standing in line at a local bank. Some numbness of the left side of the face that went all the way down to his hip but without involvement of the left leg. He has been having neck pain for the last few days and he experienced a severe neck pain at the time of the above mentioned symptoms. Wife said that his speech sounded "thick but not slurred". He said that during the episode at the [facility] he had a sensation that ws about to passed out. The entire episode lasted for about 3 hours, the first 20 minutes with constant symptoms and then intermittently. Denies associated vertigo, double vision, difficulty swallowing, confusion, slurred speech, language or vision impairment. No recent neck trauma. No use of illicit drugs, recent fever, infection, or skin rash. Drove himself to  [facility] ED for evaluation. CT/CTA brain and neck were unremarkable. MRI-DWI showed no acute intracranial abnormality. Patient admits to be under lot of stress " everywhere".    Past Medical History  Diagnosis Date  . Cellulitis of right thigh ~ 05/18/2013    Past Surgical History  Procedure Laterality Date  . Shoulder arthroscopy w/ rotator cuff repair Left 2014  . Mandible fracture surgery  1988    History reviewed. No pertinent family history.  Social History:  reports that he has never smoked. He has never used smokeless tobacco. He reports that  drinks alcohol. He reports that he  uses illicit drugs (Marijuana).  No Known Allergies  MEDICATIONS:                                                                                                                     I have reviewed  the patient's current medications.   ROS:                                                                                                                                       History obtained from the patient, wife, and chart review.  General ROS: negative for - chills, fatigue, fever, night sweats, weight gain or weight loss Psychological ROS: negative for - behavioral disorder, hallucinations, memory difficulties, mood swings or suicidal ideation Ophthalmic ROS: negative for - blurry vision, double vision, eye pain or loss of vision ENT ROS: negative for - epistaxis, nasal discharge, oral lesions, sore throat, tinnitus or vertigo Allergy and Immunology ROS: negative for - hives or itchy/watery eyes Hematological and Lymphatic ROS: negative for - bleeding problems, bruising or swollen lymph nodes Endocrine ROS: negative for - galactorrhea, hair pattern changes, polydipsia/polyuria or temperature intolerance Respiratory ROS: negative for - cough, hemoptysis, shortness of breath or wheezing Cardiovascular ROS: negative for - chest pain, dyspnea on exertion, edema or irregular heartbeat Gastrointestinal ROS: negative for - abdominal pain, diarrhea, hematemesis, nausea/vomiting or stool incontinence Genito-Urinary ROS: negative for - dysuria, hematuria, incontinence or urinary frequency/urgency Musculoskeletal ROS: negative for - joint swelling or muscular weakness Neurological ROS: as noted in HPI Dermatological ROS: negative for rash and skin lesion changes     Physical exam: pleasant male in no apparent distress. Blood pressure 133/93, pulse 70, temperature 97.1 F (36.2 C), temperature source Oral, resp. rate 24, SpO2 100.00%. Head: normocephalic. Neck: supple, no bruits, no  JVD. Cardiac: no murmurs. Lungs: clear. Abdomen: soft, no tender, no mass. Extremities: no edema.  Neurologic Examination:                                                                                                      Mental Status: Alert, awake, oriented x 4, thought content appropriate. Comprehension, naming, repetition intact. Speech fluent without evidence of aphasia.  Able to follow 3 step commands without difficulty. Cranial Nerves: II: Discs flat bilaterally; Visual fields grossly normal, pupils equal, round, reactive to light and accommodation III,IV, VI: ptosis not present, extra-ocular motions intact bilaterally V,VII: smile symmetric, facial light touch sensation normal bilaterally VIII: hearing normal bilaterally IX,X: gag reflex present XI: bilateral shoulder shrug XII: midline tongue extension Motor: Right : Upper extremity   5/5    Left:     Upper extremity   5/5  Lower extremity   5/5     Lower extremity   5/5 Tone and  bulk:normal tone throughout; no atrophy noted Sensory: Pinprick and light touch intact throughout, bilaterally Deep Tendon Reflexes:  Right: Upper Extremity   Left: Upper extremity   biceps (C-5 to C-6) 2/4   biceps (C-5 to C-6) 2/4 tricep (C7) 2/4    triceps (C7) 2/4 Brachioradialis (C6) 2/4  Brachioradialis (C6) 2/4  Lower Extremity Lower Extremity  quadriceps (L-2 to L-4) 2/4   quadriceps (L-2 to L-4) 2/4 Achilles (S1) 2/4   Achilles (S1) 2/4  Plantars: Right: downgoing   Left: downgoing Cerebellar: normal finger-to-nose,  normal heel-to-shin test Gait: No ataxia. CV: pulses palpable throughout    No results found for this basename: cbc, bmp, coags, chol, tri, ldl, hga1c    Results for orders placed during the hospital encounter of 07/31/13 (from the past 48 hour(s))  GLUCOSE, CAPILLARY     Status: None   Collection Time    07/31/13 10:12 AM      Result Value Range   Glucose-Capillary 93  70 - 99 mg/dL   Comment 1 Documented  in Chart     Comment 2 Notify RN    ETHANOL     Status: None   Collection Time    07/31/13 10:15 AM      Result Value Range   Alcohol, Ethyl (B) <11  0 - 11 mg/dL   Comment:            LOWEST DETECTABLE LIMIT FOR     SERUM ALCOHOL IS 11 mg/dL     FOR MEDICAL PURPOSES ONLY  PROTIME-INR     Status: None   Collection Time    07/31/13 10:15 AM      Result Value Range   Prothrombin Time 12.4  11.6 - 15.2 seconds   INR 0.94  0.00 - 1.49  APTT     Status: None   Collection Time    07/31/13 10:15 AM      Result Value Range   aPTT 24  24 - 37 seconds  CBC     Status: None   Collection Time    07/31/13 10:15 AM      Result Value Range   WBC 7.4  4.0 - 10.5 K/uL   RBC 4.93  4.22 - 5.81 MIL/uL   Hemoglobin 15.2  13.0 - 17.0 g/dL   HCT 16.1  09.6 - 04.5 %   MCV 87.6  78.0 - 100.0 fL   MCH 30.8  26.0 - 34.0 pg   MCHC 35.2  30.0 - 36.0 g/dL   RDW 40.9  81.1 - 91.4 %   Platelets 225  150 - 400 K/uL  DIFFERENTIAL     Status: None   Collection Time    07/31/13 10:15 AM      Result Value Range   Neutrophils Relative % 66  43 - 77 %   Neutro Abs 4.9  1.7 - 7.7 K/uL   Lymphocytes Relative 21  12 - 46 %   Lymphs Abs 1.6  0.7 - 4.0 K/uL   Monocytes Relative 9  3 - 12 %   Monocytes Absolute 0.7  0.1 - 1.0 K/uL   Eosinophils Relative 3  0 - 5 %   Eosinophils Absolute 0.2  0.0 - 0.7 K/uL   Basophils Relative 0  0 - 1 %   Basophils Absolute 0.0  0.0 - 0.1 K/uL  COMPREHENSIVE METABOLIC PANEL     Status: None   Collection Time    07/31/13 10:15 AM  Result Value Range   Sodium 137  135 - 145 mEq/L   Potassium 3.8  3.5 - 5.1 mEq/L   Chloride 101  96 - 112 mEq/L   CO2 25  19 - 32 mEq/L   Glucose, Bld 90  70 - 99 mg/dL   BUN 14  6 - 23 mg/dL   Creatinine, Ser 7.82  0.50 - 1.35 mg/dL   Calcium 9.1  8.4 - 95.6 mg/dL   Total Protein 7.1  6.0 - 8.3 g/dL   Albumin 4.0  3.5 - 5.2 g/dL   AST 21  0 - 37 U/L   ALT 27  0 - 53 U/L   Alkaline Phosphatase 82  39 - 117 U/L   Total Bilirubin  0.4  0.3 - 1.2 mg/dL   GFR calc non Af Amer >90  >90 mL/min   GFR calc Af Amer >90  >90 mL/min   Comment: (NOTE)     The eGFR has been calculated using the CKD EPI equation.     This calculation has not been validated in all clinical situations.     eGFR's persistently <90 mL/min signify possible Chronic Kidney     Disease.  POCT I-STAT TROPONIN I     Status: None   Collection Time    07/31/13 10:24 AM      Result Value Range   Troponin i, poc 0.00  0.00 - 0.08 ng/mL   Comment 3            Comment: Due to the release kinetics of cTnI,     a negative result within the first hours     of the onset of symptoms does not rule out     myocardial infarction with certainty.     If myocardial infarction is still suspected,     repeat the test at appropriate intervals.  POCT I-STAT, CHEM 8     Status: None   Collection Time    07/31/13 10:25 AM      Result Value Range   Sodium 141  135 - 145 mEq/L   Potassium 3.9  3.5 - 5.1 mEq/L   Chloride 103  96 - 112 mEq/L   BUN 15  6 - 23 mg/dL   Creatinine, Ser 2.13  0.50 - 1.35 mg/dL   Glucose, Bld 89  70 - 99 mg/dL   Calcium, Ion 0.86  1.12 - 1.23 mmol/L   TCO2 25  0 - 100 mmol/L   Hemoglobin 15.6  13.0 - 17.0 g/dL   HCT 57.8  46.9 - 62.9 %  URINE RAPID DRUG SCREEN (HOSP PERFORMED)     Status: Abnormal   Collection Time    07/31/13 11:59 AM      Result Value Range   Opiates NONE DETECTED  NONE DETECTED   Cocaine NONE DETECTED  NONE DETECTED   Benzodiazepines NONE DETECTED  NONE DETECTED   Amphetamines NONE DETECTED  NONE DETECTED   Tetrahydrocannabinol POSITIVE (*) NONE DETECTED   Barbiturates NONE DETECTED  NONE DETECTED   Comment:            DRUG SCREEN FOR MEDICAL PURPOSES     ONLY.  IF CONFIRMATION IS NEEDED     FOR ANY PURPOSE, NOTIFY LAB     WITHIN 5 DAYS.                LOWEST DETECTABLE LIMITS     FOR URINE DRUG SCREEN     Drug Class  Cutoff (ng/mL)     Amphetamine      1000     Barbiturate      200     Benzodiazepine    200     Tricyclics       300     Opiates          300     Cocaine          300     THC              50  URINALYSIS, ROUTINE W REFLEX MICROSCOPIC     Status: None   Collection Time    07/31/13 11:59 AM      Result Value Range   Color, Urine YELLOW  YELLOW   APPearance CLEAR  CLEAR   Specific Gravity, Urine 1.014  1.005 - 1.030   pH 6.5  5.0 - 8.0   Glucose, UA NEGATIVE  NEGATIVE mg/dL   Hgb urine dipstick NEGATIVE  NEGATIVE   Bilirubin Urine NEGATIVE  NEGATIVE   Ketones, ur NEGATIVE  NEGATIVE mg/dL   Protein, ur NEGATIVE  NEGATIVE mg/dL   Urobilinogen, UA 0.2  0.0 - 1.0 mg/dL   Nitrite NEGATIVE  NEGATIVE   Leukocytes, UA NEGATIVE  NEGATIVE   Comment: MICROSCOPIC NOT DONE ON URINES WITH NEGATIVE PROTEIN, BLOOD, LEUKOCYTES, NITRITE, OR GLUCOSE <1000 mg/dL.    Ct Angio Head W/cm &/or Wo Cm  07/31/2013   CLINICAL DATA:  Numbness and weakness. Right-sided neck pain.  EXAM: CT ANGIOGRAPHY HEAD AND NECK  TECHNIQUE: Multidetector CT imaging of the head and neck was performed using the standard protocol during bolus administration of intravenous contrast. Multiplanar CT image reconstructions including MIPs were obtained to evaluate the vascular anatomy. Carotid stenosis measurements (when applicable) are obtained utilizing NASCET criteria, using the distal internal carotid diameter as the denominator.  CONTRAST:  OMNIPAQUE IOHEXOL 350 MG/ML SOLN  COMPARISON:  Head CT same day  FINDINGS: CTA HEAD FINDINGS  Both internal carotid arteries are widely patent through the siphon regions. The anterior and middle cerebral vessels are normal without proximal stenosis, aneurysm or vascular malformation. Both vertebral arteries are patent to the basilar. No basilar stenosis. Posterior circulation branch vessels are normal.  Intracranial veins are normal.  No parenchymal brain lesion evident.  Review of the MIP images confirms the above findings.  CTA NECK FINDINGS  Lung apices are clear. Superior  mediastinum is unremarkable. Branching pattern of the brachiocephalic vessels in the arch is normal. No origin stenoses. Both common carotid arteries are widely patent to their respective bifurcation. Both carotid bifurcations are normal. Both cervical internal carotid arteries are normal.  Both vertebral artery origins are normal. Both vertebral arteries are widely patent through the neck.  No soft tissue lesion is seen. There is or nares cervical spondylosis.  Review of the MIP images confirms the above findings.  IMPRESSION: CTA HEAD IMPRESSION  Normal intracranial CT angiography. No evidence of dissection or vascular occlusion.  CTA NECK IMPRESSION  Normal CT of the neck vessels. No evidence of dissection or atherosclerotic disease.   Electronically Signed   By: Paulina Fusi M.D.   On: 07/31/2013 12:58   Ct Head Wo Contrast  07/31/2013   CLINICAL DATA:  Left-sided numbness and weakness; headache, dizziness, and blurred vision  EXAM: CT HEAD WITHOUT CONTRAST  TECHNIQUE: Contiguous axial images were obtained from the base of the skull through the vertex without intravenous contrast. Study was obtained within 24 hr of  patient's arrival to the emergency department.  COMPARISON:  None.  FINDINGS: The ventricles are normal in size and configuration. There is no mass, hemorrhage, extra-axial fluid collection, or midline shift. The gray and white compartments are normal. There is no demonstrable acute infarct. Bony calvarium appears intact. Mastoid air cells are clear.  IMPRESSION: Study within normal limits.   Electronically Signed   By: Bretta Bang   On: 07/31/2013 10:59   Ct Angio Neck W/cm &/or Wo/cm  07/31/2013   CLINICAL DATA:  Numbness and weakness. Right-sided neck pain.  EXAM: CT ANGIOGRAPHY HEAD AND NECK  TECHNIQUE: Multidetector CT imaging of the head and neck was performed using the standard protocol during bolus administration of intravenous contrast. Multiplanar CT image reconstructions  including MIPs were obtained to evaluate the vascular anatomy. Carotid stenosis measurements (when applicable) are obtained utilizing NASCET criteria, using the distal internal carotid diameter as the denominator.  CONTRAST:  OMNIPAQUE IOHEXOL 350 MG/ML SOLN  COMPARISON:  Head CT same day  FINDINGS: CTA HEAD FINDINGS  Both internal carotid arteries are widely patent through the siphon regions. The anterior and middle cerebral vessels are normal without proximal stenosis, aneurysm or vascular malformation. Both vertebral arteries are patent to the basilar. No basilar stenosis. Posterior circulation branch vessels are normal.  Intracranial veins are normal.  No parenchymal brain lesion evident.  Review of the MIP images confirms the above findings.  CTA NECK FINDINGS  Lung apices are clear. Superior mediastinum is unremarkable. Branching pattern of the brachiocephalic vessels in the arch is normal. No origin stenoses. Both common carotid arteries are widely patent to their respective bifurcation. Both carotid bifurcations are normal. Both cervical internal carotid arteries are normal.  Both vertebral artery origins are normal. Both vertebral arteries are widely patent through the neck.  No soft tissue lesion is seen. There is or nares cervical spondylosis.  Review of the MIP images confirms the above findings.  IMPRESSION: CTA HEAD IMPRESSION  Normal intracranial CT angiography. No evidence of dissection or vascular occlusion.  CTA NECK IMPRESSION  Normal CT of the neck vessels. No evidence of dissection or atherosclerotic disease.   Electronically Signed   By: Paulina Fusi M.D.   On: 07/31/2013 12:58     Assessment/Plan: 47 y/o without known risk factors for stroke who earlier today sustained a 3 hour episode of left sided paresthesias and weakness of the left arm with the pattern described above. Normal neuro-exam. Normal CT/CTA brain and neck as well as normal MRI-DWI. At this time his only complains is  neck pain. Transient neurological dysfunction, perhaps a TIA involving right brain. Significant stress in life!!!. I think patient can be discharge home and get TTE/TEE and prolonged cardiac monitoring as outpatient. Recommended aspirin 81 mg daily. I strongly advised patient and wife to return to the ED as soon as possible if his symptoms recur.  Wyatt Portela, MD [facility] [phone]  07/31/2013, 4:35 PM

## 2013-07-31 NOTE — ED Notes (Signed)
Pt transferred to CT.

## 2013-09-06 ENCOUNTER — Ambulatory Visit (INDEPENDENT_AMBULATORY_CARE_PROVIDER_SITE_OTHER): Payer: BC Managed Care – PPO | Admitting: Diagnostic Neuroimaging

## 2013-09-06 ENCOUNTER — Encounter: Payer: Self-pay | Admitting: Diagnostic Neuroimaging

## 2013-09-06 VITALS — BP 139/88 | HR 92 | Temp 98.4°F | Ht 72.0 in | Wt 194.0 lb

## 2013-09-06 DIAGNOSIS — R209 Unspecified disturbances of skin sensation: Secondary | ICD-10-CM

## 2013-09-06 DIAGNOSIS — H538 Other visual disturbances: Secondary | ICD-10-CM

## 2013-09-06 DIAGNOSIS — R200 Anesthesia of skin: Secondary | ICD-10-CM

## 2013-09-06 DIAGNOSIS — R51 Headache: Secondary | ICD-10-CM

## 2013-09-06 DIAGNOSIS — G459 Transient cerebral ischemic attack, unspecified: Secondary | ICD-10-CM

## 2013-09-06 DIAGNOSIS — G43009 Migraine without aura, not intractable, without status migrainosus: Secondary | ICD-10-CM

## 2013-09-06 MED ORDER — ASPIRIN 81 MG PO TABS: 81.0000 mg | ORAL_TABLET | Freq: Every day | ORAL | Status: DC

## 2013-09-06 NOTE — Patient Instructions (Signed)
Start aspirin 81mg  daily.  I will order cardiac and lab testing.  Monitor stress; try to exercise in daytime and get plenty of sleep at night.  Stay hydrated. Eat regular meals.

## 2013-09-06 NOTE — Progress Notes (Signed)
[facility]  PATIENT: Edwin Bishop DOB: 16-Aug-1966  REFERRING CLINICIAN: [place]uen HISTORY FROM: patient and wife REASON FOR VISIT: new consult   HISTORICAL  CHIEF COMPLAINT:  Chief Complaint  Patient presents with  . Eye Problem    HISTORY OF PRESENT ILLNESS:   47 year old right-handed male with no past medical history, here for evaluation of transient ischemic attack.  47/15/2014 patient had bad headache all day. He went to the panic and as he was leaving felt left face, left tongue and left arm numbness and tingling. He felt lightheaded and thought he might pass out. He felt some slurred speech. He called his wife, who is an emergency room nurse at [facility], and she told him to go to the emergency room right away. Patient went to the ER and was evaluated as a code stroke. Patient had CT, CTA, MRI which were all normal. Patient was given a "migraine cocktail" which relieved all of his symptoms. By 5 hours after onset of symptoms patient is back to baseline. Of note patient reported significantly high levels of stress.  Patient was supposed to get outpatient echocardiogram and cardiac monitoring, but patient did not schedule this as his symptoms had resolved.  Yesterday on 09/05/13, patient woke up at 3 in the morning staggering and having trouble focusing with his vision. Patient vomited on the way to primary care physician office. Patient was evaluated by Dr. Timothy Lasso, who then had patient go to ophthalmology. Ophthalmology evaluated the patient and found no significant abnormalities, however they were concerned for possible transient ischemic attack and sent patient to me for further evaluation.  Today patient feels better. He has some mild vision and focusing difficulty. No double vision, slurred speech, numbness, lightheadedness.  Patient reports remote history of migraine headaches at age 41-6 years old. He remembers missing several days of school  due to severe headache. Patient was never officially diagnosed with migraine headaches. He outgrew these headaches for the most part. Over the last year he has had several days of severe headache with photosensitivity, where he has to lie down and rest.  REVIEW OF SYSTEMS: Full 14 system review of systems performed and notable only for anxiety, blurred vision.  ALLERGIES: No Known Allergies  HOME MEDICATIONS: Outpatient Prescriptions Prior to Visit  Medication Sig Dispense Refill  . ALPRAZolam (XANAX) 0.25 MG tablet Take 1 tablet (0.25 mg total) by mouth 3 (three) times daily as needed for sleep or anxiety.  8 tablet  0  . fexofenadine (ALLEGRA) 180 MG tablet Take 180 mg by mouth daily as needed (allergies).       No facility-administered medications prior to visit.    PAST MEDICAL HISTORY: Past Medical History  Diagnosis Date  . Cellulitis of right thigh ~ 05/18/2013    PAST SURGICAL HISTORY: Past Surgical History  Procedure Laterality Date  . Shoulder arthroscopy w/ rotator cuff repair Left 2014  . Mandible fracture surgery  1988    FAMILY HISTORY: History reviewed. No pertinent family history.  SOCIAL HISTORY:  History   Social History  . Marital Status: Married    Spouse Name: Almira Coaster    Number of Children: 2  . Years of Education: College   Occupational History  .      [employer]   Social History Main Topics  . Smoking status: Never Smoker   . Smokeless tobacco: Never Used  . Alcohol Use: Yes     Comment: 05/22/2013 "0-2 alcoholic drinks/wk; most of the  time it's 0"  . Drug Use: Yes    Special: Marijuana     Comment: occ  . Sexual Activity: Yes   Other Topics Concern  . Not on file   Social History Narrative   Patient lives at home with family.   Caffeine Use: 3 cups daily     PHYSICAL EXAM  Filed Vitals:   09/06/13 0929  BP: 139/88  Pulse: 92  Temp: 98.4 F (36.9 C)  TempSrc: Oral  Height: 6' (1.829 m)  Weight: 194 lb (87.998 kg)    Not  recorded    Body mass index is 26.31 kg/(m^2).  GENERAL EXAM: Patient is in no distress  CARDIOVASCULAR: Regular rate and rhythm, no murmurs, no carotid bruits  NEUROLOGIC: MENTAL STATUS: awake, alert, language fluent, comprehension intact, naming intact CRANIAL NERVE: no papilledema on fundoscopic exam, pupils equal and reactive to light, visual fields full to confrontation, extraocular muscles intact, no nystagmus, facial sensation and strength symmetric, uvula midline, shoulder shrug symmetric, tongue midline. MOTOR: normal bulk and tone, full strength in the BUE, BLE SENSORY: normal and symmetric to light touch, pinprick, temperature, vibration COORDINATION: finger-nose-finger, fine finger movements normal REFLEXES: deep tendon reflexes present and symmetric GAIT/STATION: narrow based gait; able to walk on toes, heels and tandem; romberg is negative   DIAGNOSTIC DATA (LABS, IMAGING, TESTING) - I reviewed patient records, labs, notes, testing and imaging myself where available.  Lab Results  Component Value Date   WBC 7.4 07/31/2013   HGB 15.6 07/31/2013   HCT 46.0 07/31/2013   MCV 87.6 07/31/2013   PLT 225 07/31/2013      Component Value Date/Time   NA 141 07/31/2013 1025   K 3.9 07/31/2013 1025   CL 103 07/31/2013 1025   CO2 25 07/31/2013 1015   GLUCOSE 89 07/31/2013 1025   BUN 15 07/31/2013 1025   CREATININE 1.30 07/31/2013 1025   CALCIUM 9.1 07/31/2013 1015   PROT 7.1 07/31/2013 1015   ALBUMIN 4.0 07/31/2013 1015   AST 21 07/31/2013 1015   ALT 27 07/31/2013 1015   ALKPHOS 82 07/31/2013 1015   BILITOT 0.4 07/31/2013 1015   GFRNONAA >90 07/31/2013 1015   GFRAA >90 07/31/2013 1015   No results found for this basename: CHOL, HDL, LDLCALC, LDLDIRECT, TRIG, CHOLHDL   No results found for this basename: HGBA1C   No results found for this basename: VITAMINB12   No results found for this basename: TSH   I reviewed images myself and agree with interpretation.  07/31/13 MRI brain -  normal  07/31/13 CTA head/neck - normal    ASSESSMENT AND PLAN  47 y.o. year old male here with one episode of transient left face and arm numbness, with slurred speech, lasting 3-5 hours on 07/31/2013. Episodes suspicious for transient ischemic attack versus complicated migraine. Now with another different event yesterday consisting of blurred vision, vomiting and staggering. Will complete TIA workup, consider seizure disorder, and have patient monitor stress levels. Start aspirin 81mg  daily.  Ddx: TIA, seizure, migraine phenomenon  PLAN: Orders Placed This Encounter  Procedures  . Lipid Panel  . Hemoglobin A1c  . Ambulatory referral to Cardiology  . 2D Echocardiogram without contrast   Meds ordered this encounter  Medications  . aspirin 81 MG tablet    Sig: Take 1 tablet (81 mg total) by mouth daily.    Dispense:  30 tablet   Return in about 2 months (around 11/06/2013).  I reviewed images, labs, notes, records myself. I summarized  findings and reviewed with patient, for this high risk condition (possible transient ischemic attack) requiring high complexity decision making.   Suanne Marker, MD 09/06/2013, 10:18 AM Certified in Neurology, Neurophysiology and Neuroimaging  [facility] [address], [address] [place], [place] [address] [phone]

## 2013-09-12 ENCOUNTER — Other Ambulatory Visit: Payer: Self-pay | Admitting: *Deleted

## 2013-09-12 DIAGNOSIS — I480 Paroxysmal atrial fibrillation: Secondary | ICD-10-CM

## 2013-09-12 DIAGNOSIS — G459 Transient cerebral ischemic attack, unspecified: Secondary | ICD-10-CM

## 2013-09-12 NOTE — Addendum Note (Signed)
Addended byHermenia Fiscal on: 09/12/2013 10:09 AM   Modules accepted: Orders

## 2013-09-21 ENCOUNTER — Other Ambulatory Visit: Payer: Self-pay

## 2013-10-03 ENCOUNTER — Encounter: Payer: Self-pay | Admitting: Radiology

## 2013-10-03 ENCOUNTER — Encounter: Payer: Self-pay | Admitting: Cardiology

## 2013-10-03 ENCOUNTER — Ambulatory Visit (HOSPITAL_COMMUNITY): Payer: BC Managed Care – PPO | Attending: Cardiology | Admitting: Cardiology

## 2013-10-03 ENCOUNTER — Encounter (INDEPENDENT_AMBULATORY_CARE_PROVIDER_SITE_OTHER): Payer: BC Managed Care – PPO

## 2013-10-03 DIAGNOSIS — G43009 Migraine without aura, not intractable, without status migrainosus: Secondary | ICD-10-CM

## 2013-10-03 DIAGNOSIS — I480 Paroxysmal atrial fibrillation: Secondary | ICD-10-CM

## 2013-10-03 DIAGNOSIS — G459 Transient cerebral ischemic attack, unspecified: Secondary | ICD-10-CM

## 2013-10-03 DIAGNOSIS — H539 Unspecified visual disturbance: Secondary | ICD-10-CM | POA: Insufficient documentation

## 2013-10-03 DIAGNOSIS — R200 Anesthesia of skin: Secondary | ICD-10-CM

## 2013-10-03 DIAGNOSIS — I4891 Unspecified atrial fibrillation: Secondary | ICD-10-CM

## 2013-10-03 DIAGNOSIS — R51 Headache: Secondary | ICD-10-CM | POA: Insufficient documentation

## 2013-10-03 DIAGNOSIS — H538 Other visual disturbances: Secondary | ICD-10-CM

## 2013-10-03 NOTE — Progress Notes (Signed)
Patient ID: Edwin Bishop, male   DOB: 1966-10-25, 47 y.o.   MRN: [id number] E Cardio 30 day monitor applied

## 2013-10-03 NOTE — Progress Notes (Signed)
Echo performed. 

## 2013-12-18 ENCOUNTER — Ambulatory Visit: Payer: BC Managed Care – PPO | Admitting: Diagnostic Neuroimaging

## 2014-10-29 ENCOUNTER — Ambulatory Visit (INDEPENDENT_AMBULATORY_CARE_PROVIDER_SITE_OTHER): Payer: 59 | Admitting: Urgent Care

## 2014-10-29 VITALS — BP 134/98 | HR 84 | Temp 98.1°F | Resp 16 | Ht 74.0 in | Wt 189.8 lb

## 2014-10-29 DIAGNOSIS — J020 Streptococcal pharyngitis: Secondary | ICD-10-CM

## 2014-10-29 DIAGNOSIS — J029 Acute pharyngitis, unspecified: Secondary | ICD-10-CM

## 2014-10-29 DIAGNOSIS — J988 Other specified respiratory disorders: Secondary | ICD-10-CM

## 2014-10-29 LAB — POCT RAPID STREP A (OFFICE): RAPID STREP A SCREEN: NEGATIVE

## 2014-10-29 NOTE — Progress Notes (Signed)
    MRN: [id number] DOB: 1966-07-20  Subjective:   Edwin Bishop is a 48 y.o. male presenting for 2 day history of sore throat. Patient is concerned that he may have Strep throat, wife and child treated for Strep throat last week. Associated symptoms include feeling feverish, chills, mild difficulty swallowing, mild sinus congestion and pain. Denies cough, ear pain, ear drainage, itchy watery eyes, chest pain, chest tightness, abdominal pain, n/v, diarrhea. Admits history of seasonal allergies, denies history of asthma. Denies smoking, 3-4 alcohol drinks per week. Denies any other aggravating or relieving factors, no other questions or concerns.  Edwin Bishop currently has no medications in their medication list.  He has No Known Allergies.  Edwin Bishop  has a past medical history of Cellulitis of right thigh (~ 05/18/2013). Also  has past surgical history that includes Shoulder arthroscopy w/ rotator cuff repair (Left, 2014) and Mandible fracture surgery (1988).  ROS As in subjective.  Objective:   Vitals: BP 134/98 mmHg  Pulse 84  Temp(Src) 98.1 F (36.7 C) (Oral)  Resp 16  Ht 6\' 2"  (1.88 m)  Wt 189 lb 12.8 oz (86.093 kg)  BMI 24.36 kg/m2  SpO2 98%  Physical Exam  Constitutional: He is oriented to person, place, and time and well-developed, well-nourished, and in no distress.  HENT:  Mouth/Throat: No oropharyngeal exudate.  TM's occluded with cerumen. Nasal turbinates pink and dry. Oropharynx mild-moderate erythema but without exudates. Tonsils edematous.  Eyes: Conjunctivae and EOM are normal. Pupils are equal, round, and reactive to light. Right eye exhibits no discharge. Left eye exhibits no discharge. No scleral icterus.  Neck: Normal range of motion. Neck supple. No thyromegaly present.  Cardiovascular: Normal rate, regular rhythm, normal heart sounds and intact distal pulses.  Exam reveals no gallop and no friction rub.   No murmur heard. Pulmonary/Chest: Effort normal and  breath sounds normal. No respiratory distress. He has no wheezes. He has no rales. He exhibits no tenderness.  Abdominal: Bowel sounds are normal.  Lymphadenopathy:    He has cervical adenopathy (anterior).  Neurological: He is alert and oriented to person, place, and time.  Skin: Skin is warm and dry. No rash noted. He is not diaphoretic. No erythema.  Psychiatric: Mood and affect normal.   Results for orders placed or performed in visit on 10/29/14 (from the past 24 hour(s))  POCT rapid strep A     Status: None   Collection Time: 10/29/14 11:05 AM  Result Value Ref Range   Rapid Strep A Screen Negative Negative   Assessment and Plan :   1. Sore throat 2. Respiratory infection - Likely viral in etiology, advised supportive care, ibuprofen otc for sore throat - return to clinic if symptoms worsen, fail to resolve or as needed - POCT rapid strep A - Throat culture Loney Loh([facility]) pending   Wallis BambergMario Orlinda Slomski, PA-C [facility] [phone] 10/29/2014 11:13 AM

## 2014-10-29 NOTE — Patient Instructions (Signed)
Upper Respiratory Infection, Adult An upper respiratory infection (URI) is also sometimes known as the common cold. The upper respiratory tract includes the nose, sinuses, throat, trachea, and bronchi. Bronchi are the airways leading to the lungs. Most people improve within 1 week, but symptoms can last up to 2 weeks. A residual cough may last even longer.  CAUSES Many different viruses can infect the tissues lining the upper respiratory tract. The tissues become irritated and inflamed and often become very moist. Mucus production is also common. A cold is contagious. You can easily spread the virus to others by oral contact. This includes kissing, sharing a glass, coughing, or sneezing. Touching your mouth or nose and then touching a surface, which is then touched by another person, can also spread the virus. SYMPTOMS  Symptoms typically develop 1 to 3 days after you come in contact with a cold virus. Symptoms vary from person to person. They may include:  Runny nose.  Sneezing.  Nasal congestion.  Sinus irritation.  Sore throat.  Loss of voice (laryngitis).  Cough.  Fatigue.  Muscle aches.  Loss of appetite.  Headache.  Low-grade fever. DIAGNOSIS  You might diagnose your own cold based on familiar symptoms, since most people get a cold 2 to 3 times a year. Your caregiver can confirm this based on your exam. Most importantly, your caregiver can check that your symptoms are not due to another disease such as strep throat, sinusitis, pneumonia, asthma, or epiglottitis. Blood tests, throat tests, and X-rays are not necessary to diagnose a common cold, but they may sometimes be helpful in excluding other more serious diseases. Your caregiver will decide if any further tests are required. RISKS AND COMPLICATIONS  You may be at risk for a more severe case of the common cold if you smoke cigarettes, have chronic heart disease (such as heart failure) or lung disease (such as asthma), or if  you have a weakened immune system. The very young and very old are also at risk for more serious infections. Bacterial sinusitis, middle ear infections, and bacterial pneumonia can complicate the common cold. The common cold can worsen asthma and chronic obstructive pulmonary disease (COPD). Sometimes, these complications can require emergency medical care and may be life-threatening. PREVENTION  The best way to protect against getting a cold is to practice good hygiene. Avoid oral or hand contact with people with cold symptoms. Wash your hands often if contact occurs. There is no clear evidence that vitamin C, vitamin E, echinacea, or exercise reduces the chance of developing a cold. However, it is always recommended to get plenty of rest and practice good nutrition. TREATMENT  Treatment is directed at relieving symptoms. There is no cure. Antibiotics are not effective, because the infection is caused by a virus, not by bacteria. Treatment may include:  Increased fluid intake. Sports drinks offer valuable electrolytes, sugars, and fluids.  Breathing heated mist or steam (vaporizer or shower).  Eating chicken soup or other clear broths, and maintaining good nutrition.  Getting plenty of rest.  Using gargles or lozenges for comfort.  Controlling fevers with ibuprofen or acetaminophen as directed by your caregiver.  Increasing usage of your inhaler if you have asthma. Zinc gel and zinc lozenges, taken in the first 24 hours of the common cold, can shorten the duration and lessen the severity of symptoms. Pain medicines may help with fever, muscle aches, and throat pain. A variety of non-prescription medicines are available to treat congestion and runny nose. Your caregiver   can make recommendations and may suggest nasal or lung inhalers for other symptoms.  HOME CARE INSTRUCTIONS   Only take over-the-counter or prescription medicines for pain, discomfort, or fever as directed by your  caregiver.  Use a warm mist humidifier or inhale steam from a shower to increase air moisture. This may keep secretions moist and make it easier to breathe.  Drink enough water and fluids to keep your urine clear or pale yellow.  Rest as needed.  Return to work when your temperature has returned to normal or as your caregiver advises. You may need to stay home longer to avoid infecting others. You can also use a face mask and careful hand washing to prevent spread of the virus. SEEK MEDICAL CARE IF:   After the first few days, you feel you are getting worse rather than better.  You need your caregiver's advice about medicines to control symptoms.  You develop chills, worsening shortness of breath, or brown or red sputum. These may be signs of pneumonia.  You develop yellow or brown nasal discharge or pain in the face, especially when you bend forward. These may be signs of sinusitis.  You develop a fever, swollen neck glands, pain with swallowing, or white areas in the back of your throat. These may be signs of strep throat. SEEK IMMEDIATE MEDICAL CARE IF:   You have a fever.  You develop severe or persistent headache, ear pain, sinus pain, or chest pain.  You develop wheezing, a prolonged cough, cough up blood, or have a change in your usual mucus (if you have chronic lung disease).  You develop sore muscles or a stiff neck. Document Released: 04/28/2001 Document Revised: 01/25/2012 Document Reviewed: 02/07/2014 [employer] Patient Information 2015 [employer]. This information is not intended to replace advice given to you by your health care provider. Make sure you discuss any questions you have with your health care provider.  

## 2014-10-31 ENCOUNTER — Encounter: Payer: Self-pay | Admitting: Urgent Care

## 2014-10-31 LAB — CULTURE, GROUP A STREP

## 2014-10-31 MED ORDER — AMOXICILLIN 875 MG PO TABS: 875.0000 mg | ORAL_TABLET | Freq: Two times a day (BID) | ORAL | Status: AC

## 2014-10-31 NOTE — Addendum Note (Signed)
Addended by: Wallis BambergMANI, Philipe Laswell on: 10/31/2014 04:39 PM   Modules accepted: Orders

## 2020-11-03 ENCOUNTER — Emergency Department (HOSPITAL_BASED_OUTPATIENT_CLINIC_OR_DEPARTMENT_OTHER)
Admission: EM | Admit: 2020-11-03 | Discharge: 2020-11-03 | Disposition: A | Discharge status: Home / Self Care | Payer: No Typology Code available for payment source | Attending: Emergency Medicine | Admitting: Emergency Medicine

## 2020-11-03 ENCOUNTER — Other Ambulatory Visit: Payer: Self-pay

## 2020-11-03 ENCOUNTER — Emergency Department (HOSPITAL_BASED_OUTPATIENT_CLINIC_OR_DEPARTMENT_OTHER): Payer: No Typology Code available for payment source

## 2020-11-03 ENCOUNTER — Encounter (HOSPITAL_BASED_OUTPATIENT_CLINIC_OR_DEPARTMENT_OTHER): Payer: Self-pay | Admitting: Emergency Medicine

## 2020-11-03 DIAGNOSIS — M79641 Pain in right hand: Secondary | ICD-10-CM | POA: Diagnosis present

## 2020-11-03 NOTE — ED Triage Notes (Signed)
Pt reports injury to right hand while using a punching bag on Friday night.

## 2020-11-03 NOTE — ED Provider Notes (Signed)
[facility] EMERGENCY DEPARTMENT Provider Note   CSN: [id number] Arrival date & time: 11/03/20  9518     History Chief Complaint  Patient presents with  . Hand Injury    Edwin Bishop is a 54 y.o. male who presents to the ED today with complaint of gradual onset, constant, achy, right hand pain x 2 days. Pt reports that Friday night he was using a punching bag without any issues. The next morning he woke up with pain along the ulnar aspect of his right hand. He took Ibuprofen last night with mild relief and has been icing it however continues to have pain prompting him to come to the ED. Denies any previous injury to the hand. Denies fevers, chills, increased warmth to the joint, or any other associated symptoms.   The history is provided by the patient and medical records.       Past Medical History:  Diagnosis Date  . Cellulitis of right thigh ~ 05/18/2013   spider bite    Patient Active Problem List   Diagnosis Date Noted  . Cellulitis 05/22/2013    Past Surgical History:  Procedure Laterality Date  . MANDIBLE FRACTURE SURGERY  1988  . SHOULDER ARTHROSCOPY W/ ROTATOR CUFF REPAIR Left 2014       No family history on file.  Social History   Tobacco Use  . Smoking status: Never Smoker  . Smokeless tobacco: Never Used  Vaping Use  . Vaping Use: Never used  Substance Use Topics  . Alcohol use: Yes  . Drug use: Yes    Types: Marijuana    Comment: occ    Home Medications Prior to Admission medications   Not on File    Allergies    Patient has no known allergies.  Review of Systems   Review of Systems  Constitutional: Negative for chills and fever.  Musculoskeletal: Positive for arthralgias and joint swelling.  Skin: Negative for color change.    Physical Exam Updated Vital Signs BP 135/82 (BP Location: Left Arm)   Pulse 81   Temp 98.4 F (36.9 C) (Oral)   Resp 18   Ht 6' (1.829 m)   Wt 86.2 kg   SpO2 99%   BMI 25.77 kg/m    Physical Exam Vitals and nursing note reviewed.  Constitutional:      Appearance: He is not ill-appearing.  HENT:     Head: Normocephalic and atraumatic.  Eyes:     Conjunctiva/sclera: Conjunctivae normal.  Cardiovascular:     Rate and Rhythm: Normal rate and regular rhythm.     Pulses: Normal pulses.  Pulmonary:     Effort: Pulmonary effort is normal.     Breath sounds: Normal breath sounds. No wheezing, rhonchi or rales.  Musculoskeletal:     Comments: Mild swelling noted to dorsum of right hand compared to left with ecchymosis to the proximal base of the 4th and 5th metacarpals with associated TTP. ROM limited to the wrist s/2 pain. No tenderness to the wrist itself. Able to wiggle all fingers without difficulty. Cap refill < 2 seconds. 2+ radial pulse.   Skin:    General: Skin is warm and dry.     Coloration: Skin is not jaundiced.  Neurological:     Mental Status: He is alert.     ED Results / Procedures / Treatments   Labs (all labs ordered are listed, but only abnormal results are displayed) Labs Reviewed - No data to display  EKG None  Radiology DG Hand Complete Right  Result Date: 11/03/2020 CLINICAL DATA:  Pain around fifth metacarpal EXAM: RIGHT HAND - COMPLETE 3+ VIEW COMPARISON:  None. FINDINGS: No definite acute fracture or dislocation. There is a hazy calcific density at the ulnar aspect of the base of the fifth metacarpal. Scattered minimal degenerative changes. No area of erosion or osseous destruction. No unexpected radiopaque foreign body. Soft tissues are unremarkable. IMPRESSION: 1. No definite acute fracture or dislocation. 2. Hazy calcific density at the ulnar aspect of the base of the fifth metacarpal may reflect hydroxyapatite deposition versus CPPD which could reflect a source of pain. Electronically Signed   By: Meda Klinefelter MD   On: 11/03/2020 10:31    Procedures Procedures (including critical care time)  Medications Ordered in  ED Medications - No data to display  ED Course  I have reviewed the triage vital signs and the nursing notes.  Pertinent labs & imaging results that were available during my care of the patient were reviewed by me and considered in my medical decision making (see chart for details).    MDM Rules/Calculators/A&P                          54 year old male who presents to the ED today with complaint of pain to his right hand after using a punching bag Friday night. Endorses swelling and pain to this area with only mild improvement with ibuprofen and ice. No previous injury to this hand. On arrival to the ED vitals are stable. Patient appears to be in no acute distress. An x-ray was obtained of his hands which did not show any acute fracture dislocation however the radiologist did read a hazy calcific density at the ulnar aspect of the base of the fifth metacarpal. He states this may reflect hydroxyapatite deposition versus CPPD which could reflect a source of pain. On exam patient has swelling to his hand dorsally as well as tenderness to the specific base of the proximal fifth and fourth metacarpals with ecchymosis. Given he was using a punching bag the day before and then started having pain I do suspect that this hazy density at the ulnar aspect of the base of the fifth metacarpal may actually reflect a fracture. Will put patient in a splint and have him follow-up with orthopedics. Rice therapy discussed with patient. Ibuprofen and Tylenol as needed for pain. He is in agreement with plan and stable for discharge.  This note was prepared using Dragon voice recognition software and may include unintentional dictation errors due to the inherent limitations of voice recognition software.  Final Clinical Impression(s) / ED Diagnoses Final diagnoses:  Right hand pain    Rx / DC Orders ED Discharge Orders    None       Discharge Instructions     Please follow up with Orthopedist Dr. Dion Saucier for  further evaluation of your pain. Your xray was unequivocal today however we are treating you for a small fracture given you began having pain after boxing.   Keep splint on until you can see ortho. Do not get it wet. Take Ibuprofen and Tylenol as needed for pain. While at home please elevate your hand as much as possible. You can apply an ice pack as needed for pain.   Return to the ED for any worsening symptoms       Tanda Rockers, PA-C 11/03/20 1209    Little, Ambrose Finland, MD 11/03/20 [phone]

## 2020-11-03 NOTE — Discharge Instructions (Addendum)
Please follow up with Orthopedist Dr. Dion Saucier for further evaluation of your pain. Your xray was unequivocal today however we are treating you for a small fracture given you began having pain after boxing.   Keep splint on until you can see ortho. Do not get it wet. Take Ibuprofen and Tylenol as needed for pain. While at home please elevate your hand as much as possible. You can apply an ice pack as needed for pain.   Return to the ED for any worsening symptoms

## 2022-03-20 ENCOUNTER — Other Ambulatory Visit: Payer: Self-pay

## 2022-03-20 ENCOUNTER — Encounter (HOSPITAL_BASED_OUTPATIENT_CLINIC_OR_DEPARTMENT_OTHER): Payer: Self-pay

## 2022-03-20 DIAGNOSIS — R55 Syncope and collapse: Secondary | ICD-10-CM | POA: Diagnosis not present

## 2022-03-20 DIAGNOSIS — R42 Dizziness and giddiness: Secondary | ICD-10-CM | POA: Diagnosis present

## 2022-03-20 DIAGNOSIS — R0602 Shortness of breath: Secondary | ICD-10-CM | POA: Insufficient documentation

## 2022-03-20 LAB — TROPONIN I (HIGH SENSITIVITY): Troponin I (High Sensitivity): 5 ng/L (ref <18)

## 2022-03-20 LAB — BASIC METABOLIC PANEL
Anion gap: 8 (ref 5–15)
BUN: 22 mg/dL — ABNORMAL HIGH (ref 6–20)
CO2: 26 mmol/L (ref 22–32)
Calcium: 9.4 mg/dL (ref 8.9–10.3)
Chloride: 104 mmol/L (ref 98–111)
Creatinine, Ser: 1.02 mg/dL (ref 0.61–1.24)
GFR, Estimated: >60 mL/min (ref >60)
Glucose, Bld: 107 mg/dL — ABNORMAL HIGH (ref 70–99)
Potassium: 4 mmol/L (ref 3.5–5.1)
Sodium: 138 mmol/L (ref 135–145)

## 2022-03-20 LAB — CBC
HCT: 44.3 % (ref 39.0–52.0)
Hemoglobin: 14.7 g/dL (ref 13.0–17.0)
MCH: 30.4 pg (ref 26.0–34.0)
MCHC: 33.2 g/dL (ref 30.0–36.0)
MCV: 91.7 fL (ref 80.0–100.0)
Platelets: 238 10*3/uL (ref 150–400)
RBC: 4.83 MIL/uL (ref 4.22–5.81)
RDW: 12.3 % (ref 11.5–15.5)
WBC: 9.5 10*3/uL (ref 4.0–10.5)
nRBC: 0 % (ref 0.0–0.2)

## 2022-03-20 LAB — CBG MONITORING, ED: Glucose-Capillary: 97 mg/dL (ref 70–99)

## 2022-03-20 NOTE — ED Triage Notes (Signed)
Patient here POV from Home. ? ?Endorses Lightheadedness/Diaphoresis for approximately 2 Weeks associated with SOB. SOB has been Constant since it began as well as the Lightheadedness. ? ?No Nausea. No Emesis. No Fevers. No Cough. No Pain. ? ?NAD Noted during Triage. A&Ox4. Gcs 15. Ambulatory. ?

## 2022-03-21 ENCOUNTER — Emergency Department (HOSPITAL_BASED_OUTPATIENT_CLINIC_OR_DEPARTMENT_OTHER): Payer: 59

## 2022-03-21 ENCOUNTER — Emergency Department (HOSPITAL_BASED_OUTPATIENT_CLINIC_OR_DEPARTMENT_OTHER)
Admission: EM | Admit: 2022-03-21 | Discharge: 2022-03-21 | Disposition: A | Discharge status: Home / Self Care | Payer: 59 | Attending: Emergency Medicine | Admitting: Emergency Medicine

## 2022-03-21 DIAGNOSIS — R55 Syncope and collapse: Secondary | ICD-10-CM

## 2022-03-21 LAB — D-DIMER, QUANTITATIVE: D-Dimer, Quant: <0.27 ug/mL-FEU (ref 0.00–0.50)

## 2022-03-21 LAB — TROPONIN I (HIGH SENSITIVITY): Troponin I (High Sensitivity): 5 ng/L (ref <18)

## 2022-03-21 NOTE — Discharge Instructions (Signed)
You were seen today for episodes of near syncope and shortness of breath.  Your work-up today is reassuring.  A referral to cardiology has been placed.  You will likely benefit from an event monitor. ?

## 2022-03-21 NOTE — ED Provider Notes (Signed)
?[facility] EMERGENCY DEPT ?Provider Note ? ? ?CSN: [id number] ?Arrival date & time: 03/20/22  2149 ? ?  ? ?History ? ?Chief Complaint  ?Patient presents with  ? Shortness of Breath  ? ? ?Vitali Seibert is a 56 y.o. male. ? ?HPI ? ?  ? ?This is a 56 year old male with no reported past medical history who presents with episodes of lightheadedness and diaphoresis with associated shortness of breath.  Patient reports over the last 2 to 3 weeks he has had increasing episodes of all of the sudden feeling lightheaded and diaphoretic.  He reports associated shortness of breath.  No significant palpitations or chest pain.  He has not recently been sick.  He denies significant alcohol or drug use.  Patient states that these episodes last approximately 15 to 20 minutes.  His wife has an EKG machine at home and they were concerned regarding the tracing that he got. ? ?EKG reviewed from home.  It was a 12-lead EKG.  Showed normal sinus rhythm.  Q waves inferiorly.  No ST elevation.  No arrhythmia noted ? ?Home Medications ?Prior to Admission medications   ?Not on File  ?   ? ?Allergies    ?Patient has no known allergies.   ? ?Review of Systems   ?Review of Systems  ?Constitutional:  Positive for diaphoresis.  ?Neurological:  Positive for light-headedness.  ?All other systems reviewed and are negative. ? ?Physical Exam ?Updated Vital Signs ?BP 140/90   Pulse (!) 57   Temp 98.2 ?F (36.8 ?C)   Resp 15   Ht 1.829 m (6')   Wt 86.2 kg   SpO2 97%   BMI 25.77 kg/m?  ?Physical Exam ?Vitals and nursing note reviewed.  ?Constitutional:   ?   Appearance: He is well-developed. He is not ill-appearing.  ?HENT:  ?   Head: Normocephalic and atraumatic.  ?Eyes:  ?   Pupils: Pupils are equal, round, and reactive to light.  ?Cardiovascular:  ?   Rate and Rhythm: Normal rate and regular rhythm.  ?   Heart sounds: Normal heart sounds. No murmur heard. ?Pulmonary:  ?   Effort: Pulmonary effort is normal. No respiratory distress.   ?   Breath sounds: Normal breath sounds. No wheezing.  ?Abdominal:  ?   General: Bowel sounds are normal.  ?   Palpations: Abdomen is soft.  ?   Tenderness: There is no abdominal tenderness. There is no rebound.  ?Musculoskeletal:  ?   Cervical back: Neck supple.  ?Lymphadenopathy:  ?   Cervical: No cervical adenopathy.  ?Skin: ?   General: Skin is warm and dry.  ?Neurological:  ?   Mental Status: He is alert and oriented to person, place, and time.  ?Psychiatric:     ?   Mood and Affect: Mood normal.  ? ? ?ED Results / Procedures / Treatments   ?Labs ?(all labs ordered are listed, but only abnormal results are displayed) ?Labs Reviewed  ?BASIC METABOLIC PANEL - Abnormal; Notable for the following components:  ?    Result Value  ? Glucose, Bld 107 (*)   ? BUN 22 (*)   ? All other components within normal limits  ?CBC  ?D-DIMER, QUANTITATIVE  ?CBG MONITORING, ED  ?TROPONIN I (HIGH SENSITIVITY)  ?TROPONIN I (HIGH SENSITIVITY)  ? ? ?EKG ?EKG Interpretation ? ?Date/Time:  Friday Mar 20 2022 22:00:20 EDT ?Ventricular Rate:  89 ?PR Interval:  154 ?QRS Duration: 88 ?QT Interval:  346 ?QTC Calculation: 420 ?R Axis:  75 ?Text Interpretation: Normal sinus rhythm Normal ECG Confirmed by Ross MarcusHorton, Lyzette Reinhardt ([id number]) on 03/21/2022 1:23:24 AM ? ?Radiology ?DG Chest Port 1 View ? ?Result Date: 03/21/2022 ?CLINICAL DATA:  Lightheadedness. EXAM: PORTABLE CHEST 1 VIEW COMPARISON:  None Available. FINDINGS: The heart size and mediastinal contours are within normal limits. Both lungs are clear. The visualized skeletal structures are unremarkable. IMPRESSION: No active disease. Electronically Signed   By: Elgie CollardArash  Radparvar M.D.   On: 03/21/2022 02:06   ? ?Procedures ?Procedures  ? ? ?Medications Ordered in ED ?Medications - No data to display ? ?ED Course/ Medical Decision Making/ A&P ?  ?                        ?Medical Decision Making ?Amount and/or Complexity of Data Reviewed ?Labs: ordered. ? ? ?This patient presents to the ED for concern  of intermittent diaphoresis and near syncope, this involves an extensive number of treatment options, and is a complaint that carries with it a high risk of complications and morbidity.  The differential diagnosis includes arrhythmia, metabolic abnormality such as hypoglycemia, less likely seizure ? ?MDM:   ? ?This is a 56 year old male who presents with intermittent episodes of diaphoresis and near syncope.  He is nontoxic and vital signs are reassuring.  EKG reviewed from home and reassuring.  Repeat EKG is normal.  No evidence of arrhythmia.  Labs obtained and reviewed.  No evidence of leukocytosis.  No significant metabolic derangements.  Troponin x2 negative.  Doubt ACS.  D-dimer was sent and is negative.  This would argue against PE.  Patient has remained clinically stable.  Cardiac monitoring without evidence of arrhythmia.  Discussed with patient that next step would likely be Holter monitor given the description of symptoms. ?Admission considered for cardiac monitoring ?(Labs, imaging, consults) ? ?Labs: ?I Ordered, and personally interpreted labs.  The pertinent results include: Normal labs, normal Trope, negative D-dimer ? ?Imaging Studies ordered: ?I ordered imaging studies including x-ray without pneumothorax or pneumonia ?I independently visualized and interpreted imaging. ?I agree with the radiologist interpretation ? ?Additional history obtained from chart review.  External records from outside source obtained and reviewed including prior evaluations ? ?Cardiac Monitoring: ?The patient was maintained on a cardiac monitor.  I personally viewed and interpreted the cardiac monitored which showed an underlying rhythm of: Normal sinus rhythm ? ?Reevaluation: ?After the interventions noted above, I reevaluated the patient and found that they have :stayed the same ? ?Social Determinants of Health: ?Lives independently ? ?Disposition: Discharge ? ?Co morbidities that complicate the patient evaluation ? ?Past  Medical History:  ?Diagnosis Date  ? Cellulitis of right thigh ~ 05/18/2013  ? spider bite  ?  ? ?Medicines ?No orders of the defined types were placed in this encounter. ?  ?I have reviewed the patients home medicines and have made adjustments as needed ? ?Problem List / ED Course: ?Problem List Items Addressed This Visit   ?None ?Visit Diagnoses   ? ? Near syncope    -  Primary  ? Relevant Orders  ? Ambulatory referral to Cardiology  ? ?  ?  ? ? ? ? ? ? ? ? ? ? ? ? ?Final Clinical Impression(s) / ED Diagnoses ?Final diagnoses:  ?Near syncope  ? ? ?Rx / DC Orders ?ED Discharge Orders   ? ?      Ordered  ?  Ambulatory referral to Cardiology       ? 03/21/22 0319  ? ?  ?  ? ?  ? ? ?  ?  Shon Baton, MD ?03/21/22 0335 ? ?

## 2022-03-24 ENCOUNTER — Ambulatory Visit (INDEPENDENT_AMBULATORY_CARE_PROVIDER_SITE_OTHER): Payer: 59 | Admitting: Internal Medicine

## 2022-03-24 ENCOUNTER — Encounter: Payer: Self-pay | Admitting: Internal Medicine

## 2022-03-24 VITALS — BP 122/76 | HR 94 | Ht 72.0 in | Wt 189.4 lb

## 2022-03-24 DIAGNOSIS — R42 Dizziness and giddiness: Secondary | ICD-10-CM

## 2022-03-24 NOTE — Patient Instructions (Signed)
Medication Instructions:  ?Your physician recommends that you continue on your current medications as directed. Please refer to the Current Medication list given to you today. ? ?Lab Work: ?TSH, Catecholamine+VMA 24 hour urine ? ?If you have labs (blood work) drawn today and your tests are completely normal, you will receive your results only by: ?MyChart Message (if you have MyChart) OR ?A paper copy in the mail ?If you have any lab test that is abnormal or we need to change your treatment, we will call you to review the results. ? ?Follow-Up: ?At [facility], you and your health needs are our priority.  As part of our continuing mission to provide you with exceptional heart care, we have created designated Provider Care Teams.  These Care Teams include your primary Cardiologist (physician) and Advanced Practice Providers (APPs -  Physician Assistants and Nurse Practitioners) who all work together to provide you with the care you need, when you need it. ? ?We recommend signing up for the patient portal called "MyChart".  Sign up information is provided on this After Visit Summary.  MyChart is used to connect with patients for Virtual Visits (Telemedicine).  Patients are able to view lab/test results, encounter notes, upcoming appointments, etc.  Non-urgent messages can be sent to your provider as well.   ?To learn more about what you can do with MyChart, go to [email or web address].   ? ?Your next appointment:   ?AS NEEDED with Dr. Wyline Mood  ? ? ? ?Important Information About Sugar ? ? ? ? ? ? ?

## 2022-03-24 NOTE — Progress Notes (Signed)
?Cardiology Office Note:   ? ?Date:  03/24/2022  ? ?ID:  Edwin Bishop, DOB 06/20/1966, MRN [id number] ? ?PCP:  [facility] ?  ?[facility] Providers ?Cardiologist:  None    ? ?Referring MD: [facility]  ? ?No chief complaint on file. ?dizziness ? ?History of Present Illness:   ? ?Edwin Bishop is a 56 y.o. male with a hx of presyncope ? ?Presented to the ED 2 days ago with 2-3 weeks of LH, and sweating. He notes just siting and this episode comes on.  No issues noted in the ED. Had an episode of HA /dizziness in 2014 with normal MRI. Echo showed normal LV function, no significant valve disease ? ?Past Medical History:  ?Diagnosis Date  ? Cellulitis of right thigh ~ 05/18/2013  ? spider bite  ? ? ?Past Surgical History:  ?Procedure Laterality Date  ? MANDIBLE FRACTURE SURGERY  1988  ? SHOULDER ARTHROSCOPY W/ ROTATOR CUFF REPAIR Left 2014  ? ? ?Current Medications: ?No outpatient medications have been marked as taking for the 03/24/22 encounter (Office Visit) with Maisie Fus, MD.  ?  ? ?Allergies:   Patient has no known allergies.  ? ?Social History  ? ?Socioeconomic History  ? Marital status: Married  ?  Spouse name: Almira Coaster  ? Number of children: 2  ? Years of education: College  ? Highest education level: Not on file  ?Occupational History  ?  Employer: [employer]  ?  Comment: [employer]  ?Tobacco Use  ? Smoking status: Never  ? Smokeless tobacco: Never  ?Vaping Use  ? Vaping Use: Never used  ?Substance and Sexual Activity  ? Alcohol use: Not Currently  ? Drug use: Yes  ?  Types: Marijuana  ?  Comment: occ  ? Sexual activity: Yes  ?Other Topics Concern  ? Not on file  ?Social History Narrative  ? Patient lives at home with family.  ? Caffeine Use: 3 cups daily  ? ?Social Determinants of Health  ? ?Financial Resource Strain: Not on file  ?Food Insecurity: Not on file  ?Transportation Needs: Not on file  ?Physical Activity: Not on file  ?Stress: Not on file  ?Social  Connections: Not on file  ?  ? ?Family History: ? ? ?ROS:   ?Please see the history of present illness.    ? All other systems reviewed and are negative. ? ?EKGs/Labs/Other Studies Reviewed:   ? ?The following studies were reviewed today: ? ? ?EKG:  EKG is  ordered today.  The ekg ordered today demonstrates  ? ?NSR, sinus arrhythmia ? ?Recent Labs: ?03/20/2022: BUN 22; Creatinine, Ser 1.02; Hemoglobin 14.7; Platelets 238; Potassium 4.0; Sodium 138  ?Recent Lipid Panel ?No results found for: CHOL, TRIG, HDL, CHOLHDL, VLDL, LDLCALC, LDLDIRECT ? ? ?Risk Assessment/Calculations:   ?  ? ?    ? ?Physical Exam:   ? ?VS:   ?Vitals:  ? 03/24/22 1547  ?BP: 122/76  ?Pulse: 94  ?SpO2: 98%  ? ? ? ?Wt Readings from Last 3 Encounters:  ?03/24/22 189 lb 6.4 oz (85.9 kg)  ?03/20/22 190 lb 0.6 oz (86.2 kg)  ?11/03/20 190 lb (86.2 kg)  ?  ? ?GEN:  Well nourished, well developed in no acute distress ?HEENT: Normal ?NECK: No JVD; No carotid bruits ?LYMPHATICS: No lymphadenopathy ?CARDIAC: RRR, no murmurs, rubs, gallops ?RESPIRATORY:  Clear to auscultation without rales, wheezing or rhonchi  ?ABDOMEN: Soft, non-tender, non-distended ?MUSCULOSKELETAL:  No edema; No deformity  ?SKIN:  Warm and dry ?NEUROLOGIC:  Alert and oriented x 3 ?PSYCHIATRIC:  Normal affect  ? ?ASSESSMENT:   ? ?LH: Notes paroxysms of sweating and light headedness over the past 2-3 weeks. No syncope. No pre-excitation on his ECG. No prolonged Qtc. Not typical presentation for arrhythmia  No signs of valve disease. Will assess for pheochromocytoma.  ? ?PLAN:   ? ?In order of problems listed above: ? ?24h urine catecholamines and metanephrines ?TSH ?If abnormal will forward to his PCP ? ?   ? ?   ? ? ?Medication Adjustments/Labs and Tests Ordered: ?Current medicines are reviewed at length with the patient today.  Concerns regarding medicines are outlined above.  ?Orders Placed This Encounter  ?Procedures  ? Catecholamine+VMA, 24-Hr Urine  ? TSH  ? EKG 12-Lead  ? ?No orders  of the defined types were placed in this encounter. ? ? ?Patient Instructions  ?Medication Instructions:  ?Your physician recommends that you continue on your current medications as directed. Please refer to the Current Medication list given to you today. ? ?Lab Work: ?TSH, Catecholamine+VMA 24 hour urine ? ?If you have labs (blood work) drawn today and your tests are completely normal, you will receive your results only by: ?MyChart Message (if you have MyChart) OR ?A paper copy in the mail ?If you have any lab test that is abnormal or we need to change your treatment, we will call you to review the results. ? ?Follow-Up: ?At [facility], you and your health needs are our priority.  As part of our continuing mission to provide you with exceptional heart care, we have created designated Provider Care Teams.  These Care Teams include your primary Cardiologist (physician) and Advanced Practice Providers (APPs -  Physician Assistants and Nurse Practitioners) who all work together to provide you with the care you need, when you need it. ? ?We recommend signing up for the patient portal called "MyChart".  Sign up information is provided on this After Visit Summary.  MyChart is used to connect with patients for Virtual Visits (Telemedicine).  Patients are able to view lab/test results, encounter notes, upcoming appointments, etc.  Non-urgent messages can be sent to your provider as well.   ?To learn more about what you can do with MyChart, go to [email or web address].   ? ?Your next appointment:   ?AS NEEDED with Dr. Wyline Mood  ? ? ? ?Important Information About Sugar ? ? ? ? ? ?  ? ?Signed, ?Maisie Fus, MD  ?03/24/2022 4:20 PM    ?[facility] ?

## 2022-03-25 LAB — TSH: TSH: 1.22 u[IU]/mL (ref 0.450–4.500)

## 2022-08-11 IMAGING — CR DG HAND COMPLETE 3+V*R*
3 series · 3 of 3 positions shown · non-contrast
Comparison: None.

CLINICAL DATA: Pain around fifth metacarpal

EXAM:
RIGHT HAND - COMPLETE 3+ VIEW

[x hand pa right]
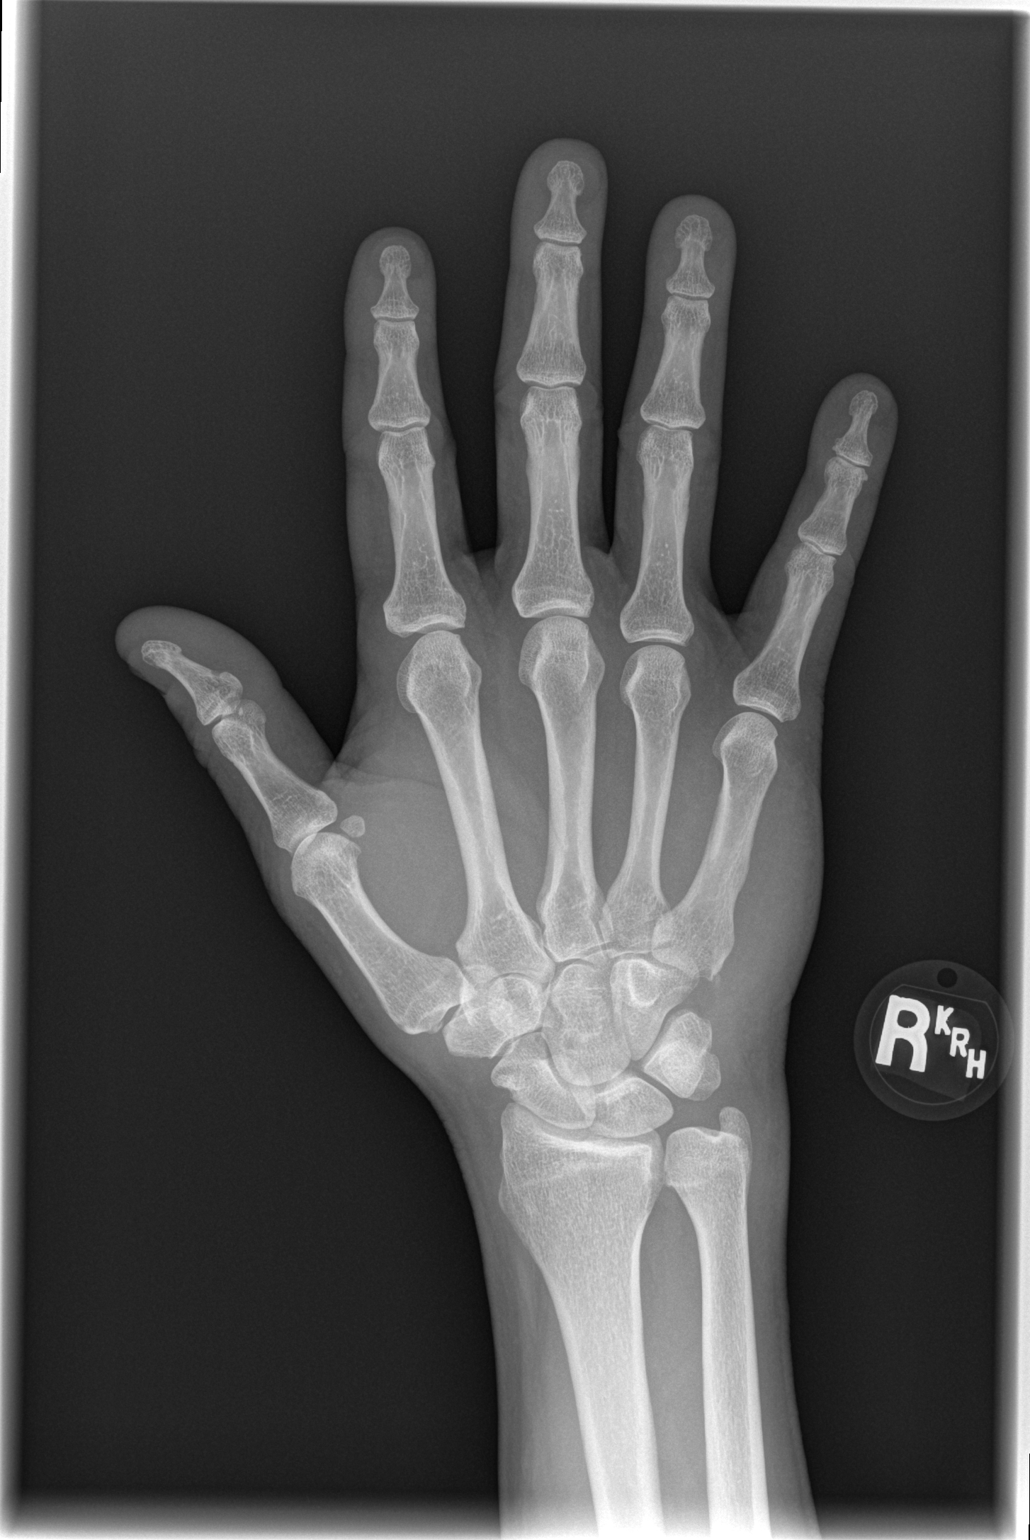

[x hand oblique right]
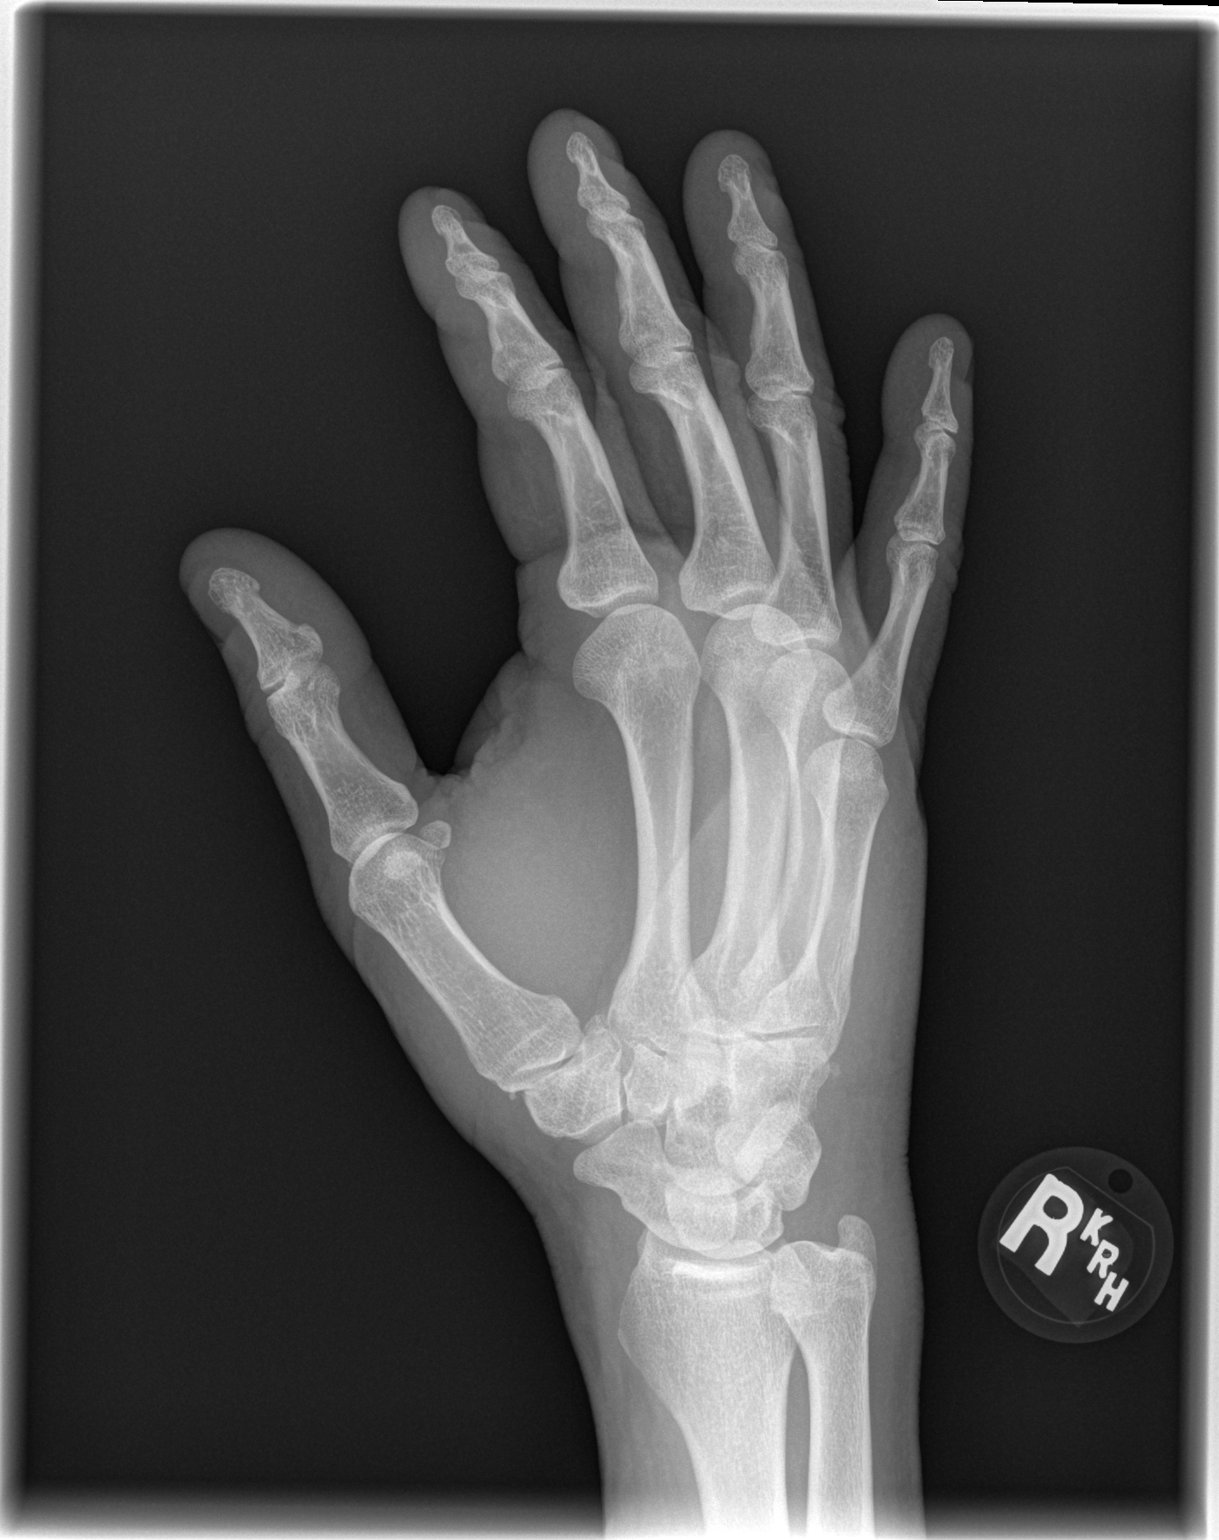

[x hand lat right]
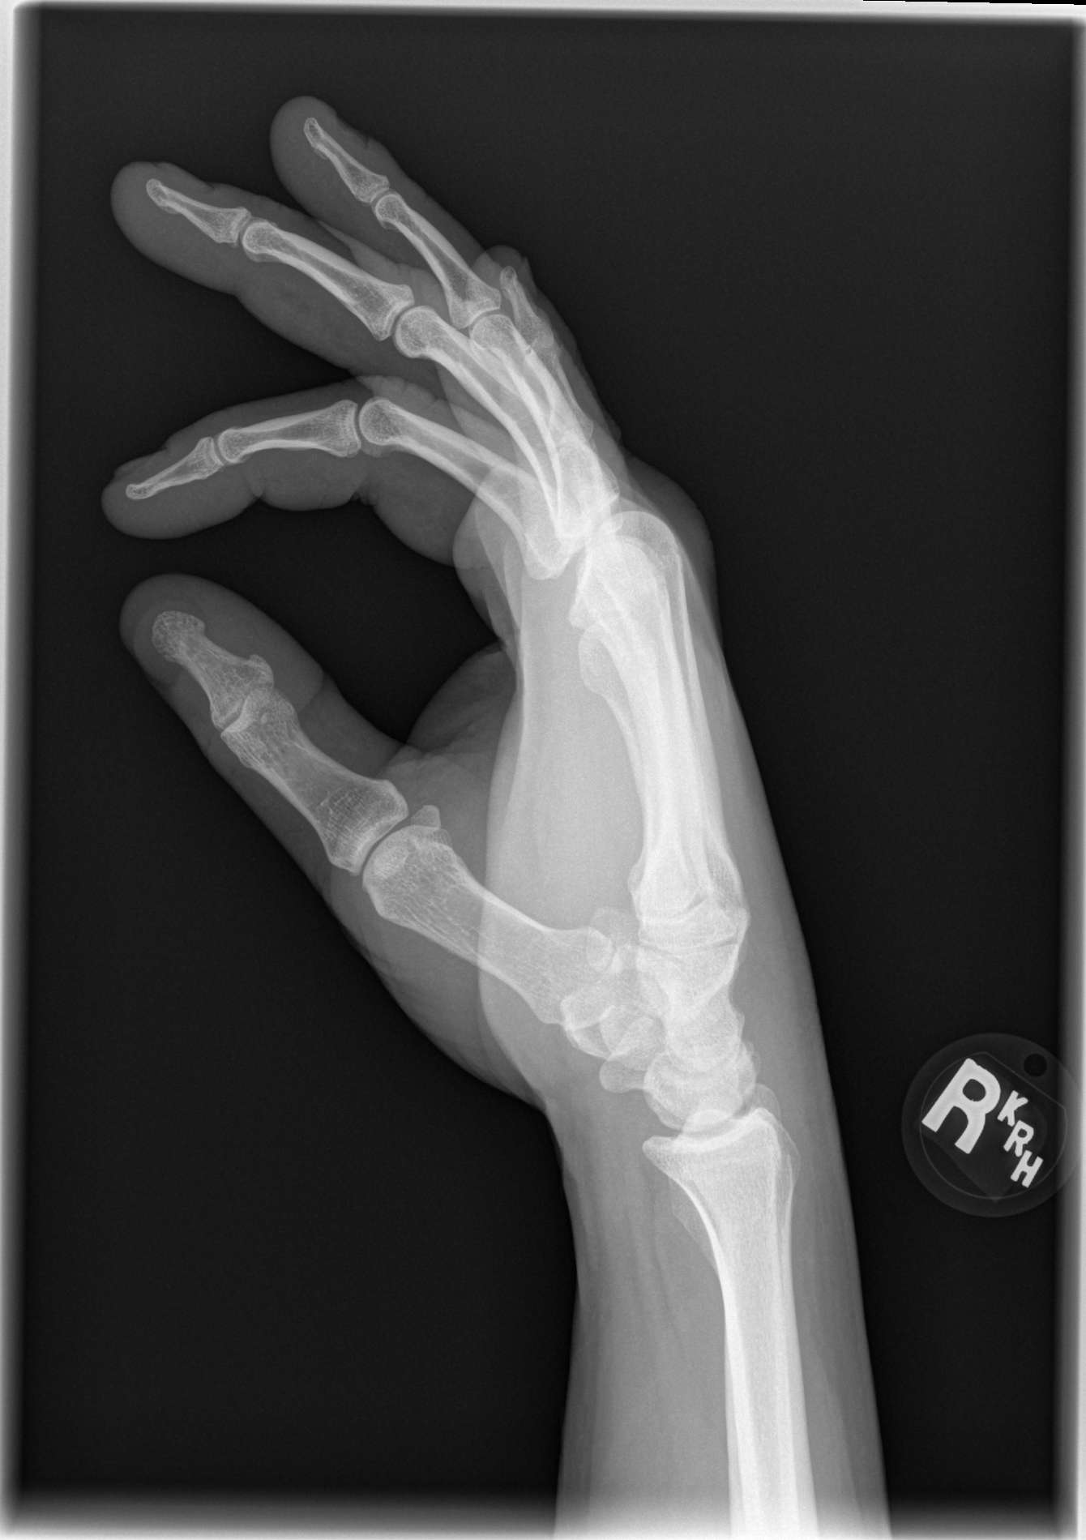

[3 of 3 positions shown; findings below may reference images not displayed]

FINDINGS: No definite acute fracture or dislocation. There is a hazy calcific
density at the ulnar aspect of the base of the fifth metacarpal.
Scattered minimal degenerative changes. No area of erosion or
osseous destruction. No unexpected radiopaque foreign body. Soft
tissues are unremarkable.
IMPRESSION: 1. No definite acute fracture or dislocation.
2. Hazy calcific density at the ulnar aspect of the base of the
fifth metacarpal may reflect hydroxyapatite deposition versus CPPD
which could reflect a source of pain.

## 2023-12-27 IMAGING — DX DG CHEST 1V PORT
1 series · 1 of 1 positions shown · non-contrast
Comparison: None Available.

CLINICAL DATA: Lightheadedness.

EXAM:
PORTABLE CHEST 1 VIEW

[chest ap]
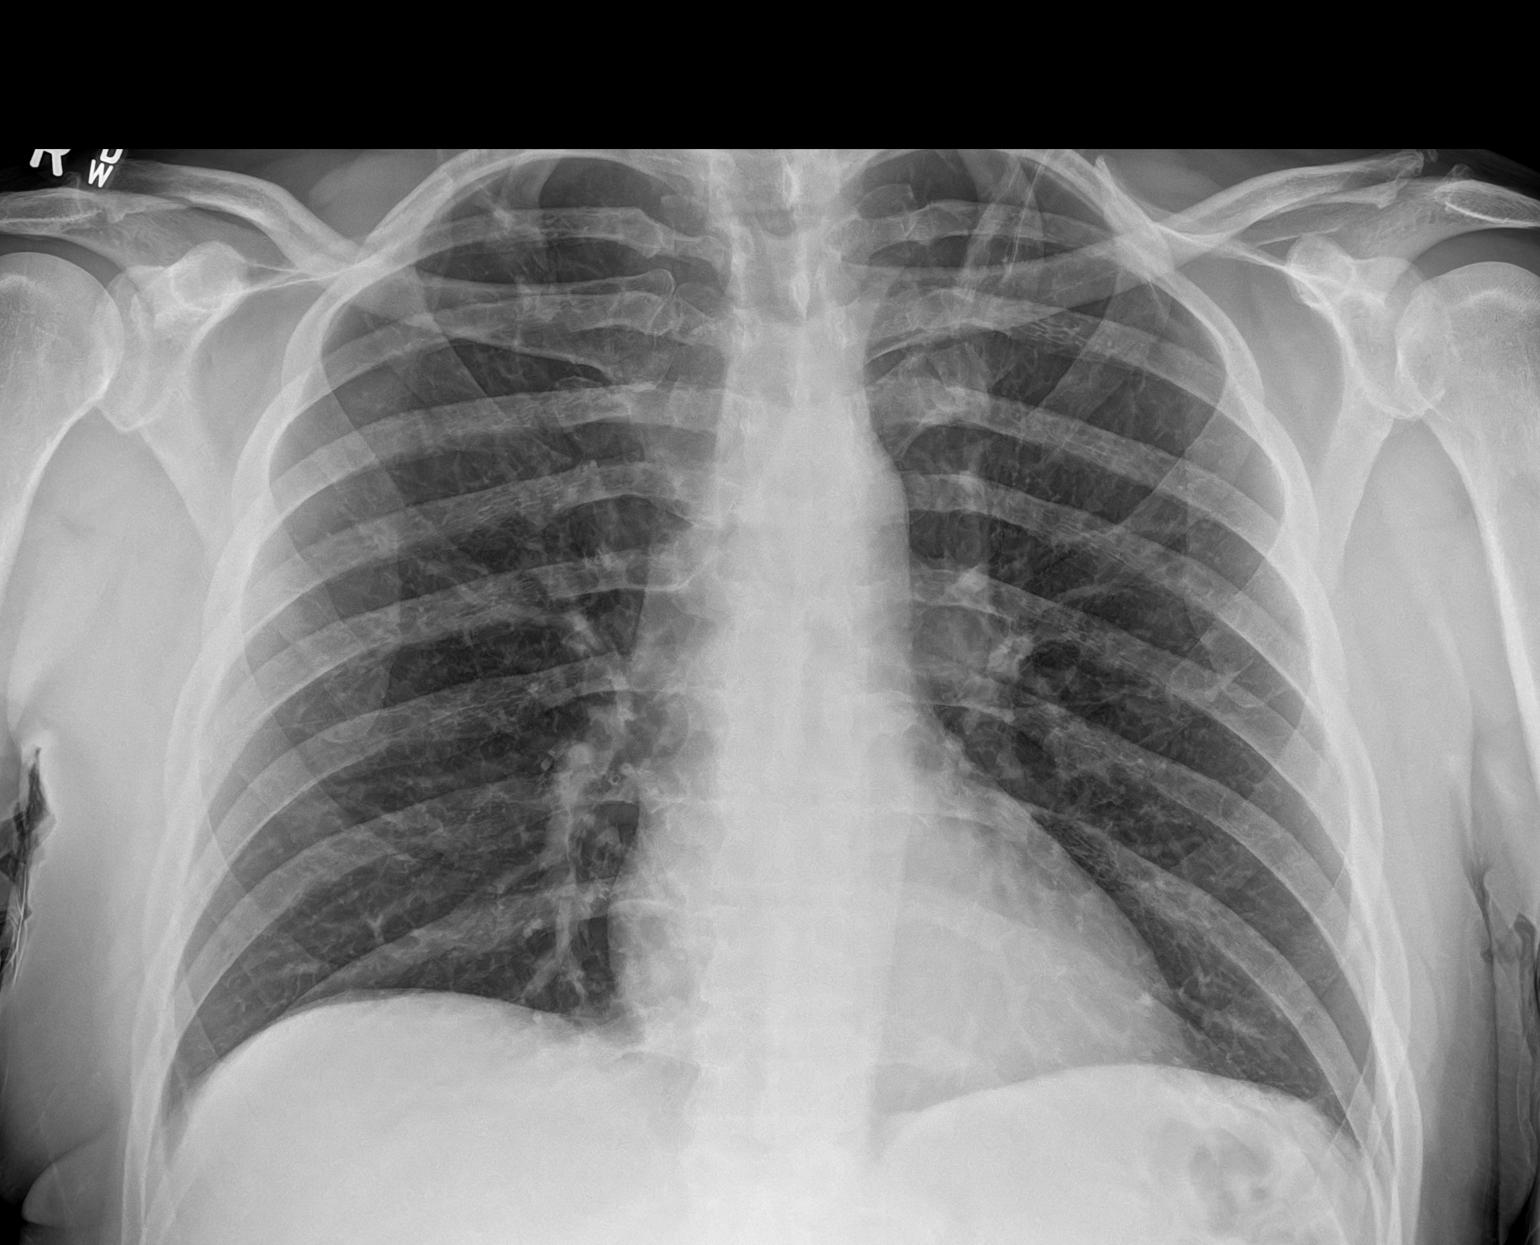

[1 of 1 positions shown; findings below may reference images not displayed]

FINDINGS: The heart size and mediastinal contours are within normal limits.
Both lungs are clear. The visualized skeletal structures are
unremarkable.
IMPRESSION: No active disease.

## 2024-09-19 ENCOUNTER — Ambulatory Visit
Admission: RE | Admit: 2024-09-19 | Discharge: 2024-09-19 | Disposition: A | Discharge status: Home / Self Care | Source: Ambulatory Visit | Attending: Chiropractic Medicine | Admitting: Chiropractic Medicine

## 2024-09-19 ENCOUNTER — Other Ambulatory Visit: Payer: Self-pay | Admitting: Chiropractic Medicine

## 2024-09-19 DIAGNOSIS — M25522 Pain in left elbow: Secondary | ICD-10-CM
